# Patient Record
Sex: Female | Born: 1975 | Race: Black or African American | Hispanic: No | Marital: Single | Smoking: Current every day smoker
Health system: Southern US, Community
[De-identification: ages and names within clinical notes are randomized; demographics above are authoritative.]

## PROBLEM LIST (undated history)

## (undated) DIAGNOSIS — N209 Urinary calculus, unspecified: Secondary | ICD-10-CM

## (undated) DIAGNOSIS — D649 Anemia, unspecified: Secondary | ICD-10-CM

## (undated) DIAGNOSIS — N289 Disorder of kidney and ureter, unspecified: Secondary | ICD-10-CM

## (undated) HISTORY — DX: Anemia, unspecified: D64.9

---
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
## 1998-07-21 ENCOUNTER — Other Ambulatory Visit: Admission: RE | Admit: 1998-07-21 | Discharge: 1998-07-21 | Payer: Self-pay | Admitting: Family Medicine

## 2004-05-31 ENCOUNTER — Emergency Department (HOSPITAL_COMMUNITY): Admission: EM | Admit: 2004-05-31 | Discharge: 2004-05-31 | Payer: Self-pay | Admitting: Emergency Medicine

## 2008-03-17 ENCOUNTER — Emergency Department (HOSPITAL_COMMUNITY): Admission: EM | Admit: 2008-03-17 | Discharge: 2008-03-17 | Payer: Self-pay | Admitting: Family Medicine

## 2008-07-13 ENCOUNTER — Emergency Department (HOSPITAL_COMMUNITY): Admission: EM | Admit: 2008-07-13 | Discharge: 2008-07-13 | Payer: Self-pay | Admitting: Emergency Medicine

## 2008-09-10 ENCOUNTER — Ambulatory Visit (HOSPITAL_COMMUNITY): Admission: RE | Admit: 2008-09-10 | Discharge: 2008-09-10 | Payer: Self-pay | Admitting: Obstetrics

## 2008-12-01 ENCOUNTER — Emergency Department (HOSPITAL_COMMUNITY): Admission: EM | Admit: 2008-12-01 | Discharge: 2008-12-01 | Payer: Self-pay | Admitting: Emergency Medicine

## 2009-10-24 ENCOUNTER — Emergency Department (HOSPITAL_COMMUNITY): Admission: EM | Admit: 2009-10-24 | Discharge: 2009-10-24 | Payer: Self-pay | Admitting: Emergency Medicine

## 2010-04-15 LAB — POCT URINALYSIS DIPSTICK
Glucose, UA: NEGATIVE mg/dL
Nitrite: NEGATIVE
Protein, ur: NEGATIVE mg/dL
Urobilinogen, UA: 0.2 mg/dL (ref 0.0–1.0)
pH: 5.5 (ref 5.0–8.0)

## 2010-04-15 LAB — HERPES SIMPLEX VIRUS CULTURE

## 2010-05-05 LAB — POCT I-STAT, CHEM 8
BUN: 6 mg/dL (ref 6–23)
Chloride: 103 mEq/L (ref 96–112)
HCT: 46 % (ref 36.0–46.0)
Potassium: 3.7 mEq/L (ref 3.5–5.1)
Sodium: 138 mEq/L (ref 135–145)

## 2010-05-05 LAB — POCT RAPID STREP A (OFFICE): Streptococcus, Group A Screen (Direct): POSITIVE — AB

## 2010-05-05 LAB — POCT URINALYSIS DIP (DEVICE)
Bilirubin Urine: NEGATIVE
Glucose, UA: NEGATIVE mg/dL
Ketones, ur: NEGATIVE mg/dL
Specific Gravity, Urine: 1.015 (ref 1.005–1.030)

## 2010-05-05 LAB — POCT PREGNANCY, URINE: Preg Test, Ur: NEGATIVE

## 2010-05-18 LAB — POCT PREGNANCY, URINE: Preg Test, Ur: NEGATIVE

## 2010-05-18 LAB — POCT URINALYSIS DIP (DEVICE)
Glucose, UA: NEGATIVE mg/dL
Ketones, ur: NEGATIVE mg/dL
Specific Gravity, Urine: >=1.03 (ref 1.005–1.030)
Urobilinogen, UA: 0.2 mg/dL (ref 0.0–1.0)

## 2010-05-18 LAB — WET PREP, GENITAL
Clue Cells Wet Prep HPF POC: NONE SEEN
Trich, Wet Prep: NONE SEEN

## 2010-05-18 LAB — GC/CHLAMYDIA PROBE AMP, GENITAL
Chlamydia, DNA Probe: NEGATIVE
GC Probe Amp, Genital: NEGATIVE

## 2012-06-13 ENCOUNTER — Ambulatory Visit (INDEPENDENT_AMBULATORY_CARE_PROVIDER_SITE_OTHER): Payer: BC Managed Care – PPO | Admitting: Family Medicine

## 2012-06-13 VITALS — BP 122/83 | HR 85 | Temp 98.2°F | Resp 16 | Ht 65.58 in | Wt 155.8 lb

## 2012-06-13 DIAGNOSIS — R51 Headache: Secondary | ICD-10-CM

## 2012-06-13 DIAGNOSIS — S0990XA Unspecified injury of head, initial encounter: Secondary | ICD-10-CM

## 2012-06-13 DIAGNOSIS — R42 Dizziness and giddiness: Secondary | ICD-10-CM

## 2012-06-13 DIAGNOSIS — IMO0002 Reserved for concepts with insufficient information to code with codable children: Secondary | ICD-10-CM

## 2012-06-13 DIAGNOSIS — S40211A Abrasion of right shoulder, initial encounter: Secondary | ICD-10-CM

## 2012-06-13 MED ORDER — MECLIZINE HCL 25 MG PO TABS: 25.0000 mg | ORAL_TABLET | Freq: Three times a day (TID) | ORAL | Status: DC | PRN
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
## 2012-06-13 NOTE — Patient Instructions (Addendum)
Rest the next couple of days. I would advise against driving since you're having the dizziness.  Take the Antivert (meclizine) one every 6 hours as needed for dizziness. Will make your little drowsy.  If worse at anytime return, or go to the emergency room if we're closed.  If not doing much better by Friday come back for recheck. I will be here Friday morning.  Head Injury, Adult You have had a head injury that does not appear serious at this time. A concussion is a state of changed mental ability, usually from a blow to the head. You should take clear liquids for the rest of the day and then resume your regular diet. You should not take sedatives or alcoholic beverages for as long as directed by your caregiver after discharge. After injuries such as yours, most problems occur within the first 24 hours. SYMPTOMS These minor symptoms may be experienced after discharge:  Memory difficulties.  Dizziness.  Headaches.  Double vision.  Hearing difficulties.  Depression.  Tiredness.  Weakness.  Difficulty with concentration. If you experience any of these problems, you should not be alarmed. A concussion requires a few days for recovery. Many patients with head injuries frequently experience such symptoms. Usually, these problems disappear without medical care. If symptoms last for more than one day, notify your caregiver. See your caregiver sooner if symptoms are becoming worse rather than better. HOME CARE INSTRUCTIONS   During the next 24 hours you must stay with someone who can watch you for the warning signs listed below. Although it is unlikely that serious side effects will occur, you should be aware of signs and symptoms which may necessitate your return to this location. Side effects may occur up to 7  10 days following the injury. It is important for you to carefully monitor your condition and contact your caregiver or seek immediate medical attention if there is a change in  your condition. SEEK IMMEDIATE MEDICAL CARE IF:   There is confusion or drowsiness.  You can not awaken the injured person.  There is nausea (feeling sick to your stomach) or continued, forceful vomiting.  You notice dizziness or unsteadiness which is getting worse, or inability to walk.  You have convulsions or unconsciousness.  You experience severe, persistent headaches not relieved by over-the-counter or prescription medicines for pain. (Do not take aspirin as this impairs clotting abilities). Take other pain medications only as directed.  You can not use arms or legs normally.  There is clear or bloody discharge from the nose or ears. MAKE SURE YOU:   Understand these instructions.  Will watch your condition.  Will get help right away if you are not doing well or get worse. Document Released: 01/17/2005 Document Revised: 04/11/2011 Document Reviewed: 12/05/2008 [facility] Patient Information 2013 [place], [place].

## 2013-10-18 ENCOUNTER — Other Ambulatory Visit (HOSPITAL_COMMUNITY): Payer: Self-pay | Admitting: Obstetrics

## 2013-10-18 DIAGNOSIS — Z1231 Encounter for screening mammogram for malignant neoplasm of breast: Secondary | ICD-10-CM

## 2013-10-31 ENCOUNTER — Ambulatory Visit (HOSPITAL_COMMUNITY)
Admission: RE | Admit: 2013-10-31 | Discharge: 2013-10-31 | Disposition: A | Discharge status: Home / Self Care | Payer: BC Managed Care – PPO | Source: Ambulatory Visit | Attending: Obstetrics | Admitting: Obstetrics

## 2013-10-31 DIAGNOSIS — Z1231 Encounter for screening mammogram for malignant neoplasm of breast: Secondary | ICD-10-CM

## 2013-10-31 LAB — CYTOLOGY - PAP: Pap: NEGATIVE

## 2015-10-22 ENCOUNTER — Encounter: Payer: Self-pay | Admitting: Obstetrics & Gynecology

## 2015-10-22 ENCOUNTER — Ambulatory Visit (INDEPENDENT_AMBULATORY_CARE_PROVIDER_SITE_OTHER): Payer: 59 | Admitting: Obstetrics & Gynecology

## 2015-10-22 ENCOUNTER — Encounter: Payer: Self-pay | Admitting: *Deleted

## 2015-10-22 VITALS — BP 108/75 | HR 96 | Wt 160.0 lb

## 2015-10-22 DIAGNOSIS — Z01419 Encounter for gynecological examination (general) (routine) without abnormal findings: Secondary | ICD-10-CM | POA: Diagnosis not present

## 2015-10-22 DIAGNOSIS — Z124 Encounter for screening for malignant neoplasm of cervix: Secondary | ICD-10-CM

## 2015-10-22 DIAGNOSIS — N915 Oligomenorrhea, unspecified: Secondary | ICD-10-CM | POA: Diagnosis not present

## 2015-10-22 DIAGNOSIS — Z1151 Encounter for screening for human papillomavirus (HPV): Secondary | ICD-10-CM | POA: Diagnosis not present

## 2015-10-22 LAB — PROLACTIN: PROLACTIN: 9.4 ng/mL

## 2015-10-22 LAB — FOLLICLE STIMULATING HORMONE: FSH: 5.3 m[IU]/mL

## 2015-10-22 LAB — CBC
HCT: 36.2 % (ref 35.0–45.0)
Hemoglobin: 12.2 g/dL (ref 11.7–15.5)
MCH: 29.4 pg (ref 27.0–33.0)
MCHC: 33.7 g/dL (ref 32.0–36.0)
MCV: 87.2 fL (ref 80.0–100.0)
MPV: 8.9 fL (ref 7.5–12.5)
PLATELETS: 379 10*3/uL (ref 140–400)
RBC: 4.15 MIL/uL (ref 3.80–5.10)
RDW: 14.2 % (ref 11.0–15.0)
WBC: 8.1 10*3/uL (ref 3.8–10.8)

## 2015-10-22 LAB — TSH: TSH: 1.24 mIU/L

## 2015-10-22 LAB — HCG, QUANTITATIVE, PREGNANCY: hCG, Beta Chain, Quant, S: <2 m[IU]/mL

## 2015-10-22 MED ORDER — MEDROXYPROGESTERONE ACETATE 10 MG PO TABS: 10.0000 mg | ORAL_TABLET | Freq: Every day | ORAL | 5 refills | Status: AC

## 2015-10-22 NOTE — Patient Instructions (Signed)
Thank you for enrolling in MyChart. Please follow the instructions below to securely access your online medical record. MyChart allows you to send messages to your doctor, view your test results, manage appointments, and more.   How Do I Sign Up? 1. In your Internet browser, go to [employer] Address [employer] and enter [email or web address]. 2. Click on the Sign Up Now link in the Sign In box. You will see the New Member Sign Up page. 3. Enter your MyChart Access Code exactly as it appears below. You will not need to use this code after you've completed the sign-up process. If you do not sign up before the expiration date, you must request a new code.  MyChart Access Code: [email or web address] Expires: 12/21/2015  9:39 AM  4. Enter your Social Security Number ([id number]) and Date of Birth (mm/dd/yyyy) as indicated and click Submit. You will be taken to the next sign-up page. 5. Create a MyChart ID. This will be your MyChart login ID and cannot be changed, so think of one that is secure and easy to remember. 6. Create a MyChart password. You can change your password at any time. 7. Enter your Password Reset Question and Answer. This can be used at a later time if you forget your password.  8. Enter your e-mail address. You will receive e-mail notification when new information is available in MyChart. 9. Click Sign Up. You can now view your medical record.   Additional Information Remember, MyChart is NOT to be used for urgent needs. For medical emergencies, dial 911.

## 2015-10-22 NOTE — Progress Notes (Signed)
GYNECOLOGY ANNUAL PREVENTATIVE CARE ENCOUNTER NOTE  Subjective:   Tonya Howard is a 40 y.o. G14P0010 female here for a routine annual gynecologic exam and to establish care, was Dr. Elsie Stain patient.  Current complaints: patient has been oligomenorrheic since menarche. Was given Provera challenges 3-4 times a year by Dr. Gaynell Face, also had an early SAB a few years ago that was spontaneously conceived. Started to think about pregnancy; says Dr. Gaynell Face was going to give her Clomid. Does not recall having any evaluation for etiology of her oligomenorrhea.      Denies abnormal vaginal bleeding, discharge, pelvic pain, problems with intercourse or other gynecologic concerns.    Gynecologic History No LMP recorded. Contraception: none Last Pap: 10/2013. Results were: normal Last mammogram: 10/2013. Results were: normal  Obstetric History OB History  Gravida Para Term Preterm AB Living  1       1    SAB TAB Ectopic Multiple Live Births  1            # Outcome Date GA Lbr Len/2nd Weight Sex Delivery Anes PTL Lv  1 SAB               Past Medical History:  Diagnosis Date  . Anemia     No past surgical history on file.  No current outpatient prescriptions on file prior to visit.   No current facility-administered medications on file prior to visit.     No Known Allergies  Social History   Social History  . Marital status: Single    Spouse name: N/A  . Number of children: N/A  . Years of education: N/A   Occupational History  . Not on file.   Social History Main Topics  . Smoking status: Never Smoker  . Smokeless tobacco: Not on file  . Alcohol use Yes  . Drug use: No  . Sexual activity: Yes   Other Topics Concern  . Not on file   Social History Narrative  . No narrative on file    Family History  Problem Relation Age of Onset  . Diabetes Father   . Cancer Maternal Grandmother   . Diabetes Paternal Grandfather     The following portions of the  patient's history were reviewed and updated as appropriate: allergies, current medications, past family history, past medical history, past social history, past surgical history and problem list.  Review of Systems Pertinent items noted in HPI and remainder of comprehensive ROS otherwise negative.   Objective:  BP 108/75   Pulse 96   Wt 160 lb (72.6 kg)   BMI 26.16 kg/m  CONSTITUTIONAL: Well-developed, well-nourished female in no acute distress.  HENT:  Normocephalic, atraumatic, External right and left ear normal. Oropharynx is clear and moist EYES: Conjunctivae and EOM are normal. Pupils are equal, round, and reactive to light. No scleral icterus.  NECK: Normal range of motion, supple, no masses.  Normal thyroid.  SKIN: Skin is warm and dry. No rash noted. Not diaphoretic. No erythema. No pallor. NEUROLOGIC: Alert and oriented to person, place, and time. Normal reflexes, muscle tone coordination. No cranial nerve deficit noted. PSYCHIATRIC: Normal mood and affect. Normal behavior. Normal judgment and thought content. CARDIOVASCULAR: Normal heart rate noted, regular rhythm RESPIRATORY: Clear to auscultation bilaterally. Effort and breath sounds normal, no problems with respiration noted. BREASTS: Symmetric in size. No masses, skin changes, nipple drainage, or lymphadenopathy. ABDOMEN: Soft, normal bowel sounds, no distention noted.  No tenderness, rebound or guarding.  PELVIC:  Normal appearing external genitalia; normal appearing vaginal mucosa and cervix.  No abnormal discharge noted.  Pap smear obtained.  Normal uterine size, no other palpable masses, no uterine or adnexal tenderness. MUSCULOSKELETAL: Normal range of motion. No tenderness.  No cyanosis, clubbing, or edema.  2+ distal pulses.   Assessment:  Annual gynecologic examination with pap smear Oligomenorrhea   Plan:  1. Oligomenorrhea Will do evaluation of oligomenorrhea. Provera challenge also given. Will follow up results  and manage accordingly. Recommended infertility specialist if she wanted infertility treatment. - CBC - Follicle stimulating hormone - TSH - Testos,Total,Free and SHBG (Female) - Prolactin - Hemoglobin A1c - hCG, quantitative, pregnancy - medroxyPROGESTERone (PROVERA) 10 MG tablet; Take 1 tablet (10 mg total) by mouth daily. Use for ten days  Dispense: 10 tablet; Refill: 5 - Anti mullerian hormone  2. Encounter for gynecological examination with Papanicolaou smear of cervix - Cytology - PAP Will follow up results of pap smear and manage accordingly. Routine preventative health maintenance measures emphasized. Please refer to After Visit Summary for other counseling recommendations.    Jaynie CollinsUGONNA  Kerri Asche, MD, FACOG Attending Obstetrician & Gynecologist,  [facility]

## 2015-10-23 LAB — HEMOGLOBIN A1C
Hgb A1c MFr Bld: 5.4 % (ref <5.7)
Mean Plasma Glucose: 108 mg/dL

## 2015-10-23 LAB — CYTOLOGY - PAP

## 2015-10-24 LAB — ANTI MULLERIAN HORMONE: AMH ASSESSR: 7.41 ng/mL

## 2015-10-25 LAB — TESTOS,TOTAL,FREE AND SHBG (FEMALE)
SEX HORMONE BINDING GLOB.: 20 nmol/L (ref 17–124)
TESTOSTERONE,FREE: 4.1 pg/mL (ref 0.1–6.4)
Testosterone,Total,LC/MS/MS: 24 ng/dL (ref 2–45)

## 2015-12-04 ENCOUNTER — Encounter (HOSPITAL_COMMUNITY): Payer: Self-pay | Admitting: Emergency Medicine

## 2015-12-04 ENCOUNTER — Inpatient Hospital Stay (HOSPITAL_COMMUNITY)
Admission: EM | Admit: 2015-12-04 | Discharge: 2015-12-07 | DRG: 694 | Disposition: A | Discharge status: Home / Self Care | Payer: Self-pay | Attending: Family Medicine | Admitting: Family Medicine

## 2015-12-04 ENCOUNTER — Emergency Department (HOSPITAL_COMMUNITY): Payer: Self-pay

## 2015-12-04 ENCOUNTER — Ambulatory Visit (HOSPITAL_COMMUNITY)
Admission: EM | Admit: 2015-12-04 | Discharge: 2015-12-04 | Disposition: A | Discharge status: Nursing Home (Includes ICF) | Payer: 59 | Attending: Family Medicine | Admitting: Family Medicine

## 2015-12-04 DIAGNOSIS — R1031 Right lower quadrant pain: Secondary | ICD-10-CM | POA: Insufficient documentation

## 2015-12-04 DIAGNOSIS — N200 Calculus of kidney: Secondary | ICD-10-CM

## 2015-12-04 DIAGNOSIS — Z91013 Allergy to seafood: Secondary | ICD-10-CM

## 2015-12-04 DIAGNOSIS — N132 Hydronephrosis with renal and ureteral calculous obstruction: Principal | ICD-10-CM | POA: Diagnosis present

## 2015-12-04 DIAGNOSIS — N390 Urinary tract infection, site not specified: Secondary | ICD-10-CM | POA: Diagnosis not present

## 2015-12-04 DIAGNOSIS — D649 Anemia, unspecified: Secondary | ICD-10-CM | POA: Insufficient documentation

## 2015-12-04 DIAGNOSIS — R8281 Pyuria: Secondary | ICD-10-CM

## 2015-12-04 DIAGNOSIS — Z87891 Personal history of nicotine dependence: Secondary | ICD-10-CM

## 2015-12-04 DIAGNOSIS — R1010 Upper abdominal pain, unspecified: Secondary | ICD-10-CM | POA: Diagnosis not present

## 2015-12-04 LAB — URINALYSIS, ROUTINE W REFLEX MICROSCOPIC
BILIRUBIN URINE: NEGATIVE
GLUCOSE, UA: NEGATIVE mg/dL
KETONES UR: NEGATIVE mg/dL
NITRITE: NEGATIVE
PH: 7.5 (ref 5.0–8.0)
Protein, ur: 100 mg/dL — AB
SPECIFIC GRAVITY, URINE: 1.019 (ref 1.005–1.030)

## 2015-12-04 LAB — COMPREHENSIVE METABOLIC PANEL
ALK PHOS: 81 U/L (ref 38–126)
ALT: 13 U/L — AB (ref 14–54)
AST: 18 U/L (ref 15–41)
Albumin: 3.3 g/dL — ABNORMAL LOW (ref 3.5–5.0)
Anion gap: 6 (ref 5–15)
BUN: 8 mg/dL (ref 6–20)
CALCIUM: 9.8 mg/dL (ref 8.9–10.3)
CHLORIDE: 104 mmol/L (ref 101–111)
CO2: 27 mmol/L (ref 22–32)
CREATININE: 1.1 mg/dL — AB (ref 0.44–1.00)
GFR calc non Af Amer: >60 mL/min (ref >60)
GLUCOSE: 130 mg/dL — AB (ref 65–99)
Potassium: 4.1 mmol/L (ref 3.5–5.1)
SODIUM: 137 mmol/L (ref 135–145)
Total Bilirubin: 0.3 mg/dL (ref 0.3–1.2)
Total Protein: 8.5 g/dL — ABNORMAL HIGH (ref 6.5–8.1)

## 2015-12-04 LAB — CBC
HCT: 34 % — ABNORMAL LOW (ref 36.0–46.0)
Hemoglobin: 11 g/dL — ABNORMAL LOW (ref 12.0–15.0)
MCH: 28.7 pg (ref 26.0–34.0)
MCHC: 32.4 g/dL (ref 30.0–36.0)
MCV: 88.8 fL (ref 78.0–100.0)
PLATELETS: 432 10*3/uL — AB (ref 150–400)
RBC: 3.83 MIL/uL — AB (ref 3.87–5.11)
RDW: 13.4 % (ref 11.5–15.5)
WBC: 9.9 10*3/uL (ref 4.0–10.5)

## 2015-12-04 LAB — PROTIME-INR
INR: 1.02
Prothrombin Time: 13.5 seconds (ref 11.4–15.2)

## 2015-12-04 LAB — I-STAT BETA HCG BLOOD, ED (MC, WL, AP ONLY)

## 2015-12-04 LAB — APTT: APTT: 34 s (ref 24–36)

## 2015-12-04 LAB — URINE MICROSCOPIC-ADD ON

## 2015-12-04 LAB — LIPASE, BLOOD: LIPASE: 48 U/L (ref 11–51)

## 2015-12-04 MED ORDER — IOPAMIDOL (ISOVUE-300) INJECTION 61%
INTRAVENOUS | Status: AC
  Administered 2015-12-04: 100 mL
  Filled 2015-12-04: qty 100

## 2015-12-04 MED ORDER — GI COCKTAIL ~~LOC~~
30.0000 mL | Freq: Once | ORAL | Status: AC
  Administered 2015-12-04: 30 mL via ORAL
  Filled 2015-12-04: qty 30

## 2015-12-04 MED ORDER — DEXTROSE 5 % IV SOLN
1.0000 g | INTRAVENOUS | Status: DC
  Administered 2015-12-05 – 2015-12-07 (×3): 1 g via INTRAVENOUS
  Filled 2015-12-04 (×3): qty 10

## 2015-12-04 MED ORDER — FOLIC ACID 1 MG PO TABS
1.0000 mg | ORAL_TABLET | Freq: Every day | ORAL | Status: DC
  Administered 2015-12-05 – 2015-12-07 (×3): 1 mg via ORAL
  Filled 2015-12-04 (×3): qty 1

## 2015-12-04 MED ORDER — ENOXAPARIN SODIUM 40 MG/0.4ML ~~LOC~~ SOLN
40.0000 mg | SUBCUTANEOUS | Status: DC
  Administered 2015-12-04 – 2015-12-06 (×2): 40 mg via SUBCUTANEOUS
  Filled 2015-12-04 (×2): qty 0.4

## 2015-12-04 MED ORDER — PANTOPRAZOLE SODIUM 40 MG PO TBEC
40.0000 mg | DELAYED_RELEASE_TABLET | Freq: Every day | ORAL | Status: DC
  Administered 2015-12-04 – 2015-12-07 (×4): 40 mg via ORAL
  Filled 2015-12-04 (×4): qty 1

## 2015-12-04 MED ORDER — ACETAMINOPHEN 325 MG PO TABS
650.0000 mg | ORAL_TABLET | Freq: Four times a day (QID) | ORAL | Status: DC | PRN
  Administered 2015-12-04 – 2015-12-05 (×3): 650 mg via ORAL
  Filled 2015-12-04 (×3): qty 2

## 2015-12-04 MED ORDER — DEXTROSE 5 % IV SOLN
1.0000 g | Freq: Once | INTRAVENOUS | Status: AC
  Administered 2015-12-04: 1 g via INTRAVENOUS
  Filled 2015-12-04: qty 10

## 2015-12-04 MED ORDER — SODIUM CHLORIDE 0.9 % IV SOLN
INTRAVENOUS | Status: DC
  Administered 2015-12-04: 23:00:00 via INTRAVENOUS

## 2015-12-04 NOTE — H&P (Signed)
Family Medicine [facility] Admission History and Physical Service Pager: [phone]  Patient name: Tonya Howard Medical record number: [id number] Date of birth: Sep 02, 1975 Age: 40 y.o. Gender: female  Primary Care Provider: No PCP Per Patient Consultants: Urology, IR Code Status: FULL  Chief Complaint: RUQ abdominal pain  Assessment and Plan: Tonya Howard is a 40 y.o. female presenting with RUQ pain . No significant PMH.   #Abdominal pain.  Non acute abdomen, no pain when evaluated by tis team. On admission, VSS, afebrile and no white count, WBC 9.9 .  Previously presented to urgent care clinic for ongoing RUQ pain x 6 months that has worsened over past 2 weeks by extending to to lower abdominal pain.  Pain more frequent and intense after eating.  Has seen OB/GYN and PCP with negative work up at these visits  Today, Lipase wnl 48, AST/ALT 18/13, Tbili 0.3.  Marland Kitchen.  Urine pregnancy test negative.  UA dirty and turbid with many bacteria, wbcs too numerous to count , mod hgb, large leukoctes, and negative for nitrite.  Ucx, Bcx obtained and s/p 1 dose CTX in ED.   PT, PTT, INR wnl. 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  Additional right renal stones. Abdominal pain likely due to large right renal calculus, less likely pancreatic or hepatic in nature given normal labs. Consider biliary colic vs GERD given pain associated with eating. Urology was consulted by the ED regarding stone and given size as well as concern for infection they recommended admission for further work up -Admit to inpatient, attending Dr. Jennette KettleNeal  -Gentle Fluids IV NS @75  cc.hr -Consult urology in AM - (Per ED provider, Dr. Annabell HowellsWrenn likely will place a stent next week but can be done as outpatient).  -IR to perform percutaneous nephrostomy tomorrow morning, per urology recs [facility](Borofsky MS,  Wilford SportsWalter D, Shah O, Goldfarb DS, [facility]ues [facility], Dionisio DavidMakarov DV Surgical decompression is associated with decreased mortality in patients with sepsis and ureteral calculi. J Urol. 2013;189(3):946. Epub 2012 Sep 24.) -NPO at midnight tonight  -monitor for fevers -Tylenol 650 mg Q6h prn -Blood cx  -Urine cx -CBC  -AM BMET  -vitals per unit routine  #UTI . No CVA tenderness or fever making pyelonephritis less likely. No evidence of sepsis. Patient reporting urinary frequency/urgency x 3 months. Was prescribed antibiotics in the past with no improvement. No dysuria, or back pain. She does report episodic lower abdominal pain.  UA as above. Given 1 dose CTX in ED.  -Continue CTX 1 g daily -Urine cx -abx on discharge per urology recs   FEN/GI: NPO after midnight, IV NS @ 75cc.hr Prophylaxis: Lovenox  Disposition: Admit to inpatient, attending Dr. Jennette KettleNeal  History of Present Illness:  Tonya Howard is a 40 y.o. female presenting with abdominal pain.  Patient states she has been having RUQ  abdominal pain for the past 6-7 months which has recently worsened over the past 2 weeks.  Urine has been cloudy as well. Over the last 2 weeks she has noted her pain sometimes her pain radiates to the lower abdomen.  Endorses episodes of sweating at home when she has the pain, denies fevers.  Reports she has been having increased urgency and frequency on urination but denies burning.  Pain worsens with eating and heat packs have helped improve the pain somewhat. Has been to her doctor and was prescribed  antibiotics in the past but with no improvement.  Has also seen her gynecologist for workup. Denies nausea, emesis, diarrhea. Denies back pain. Denies chest pain, shortness of breath   No history of drug use.  Drinks alcohol socially.  Stopped smoking 3 weeks ago, used to smoke about a pack/week.  Is not currently pregnant.    Review Of Systems: Per HPI with the following additions:  Review of Systems  Constitutional:  Negative for chills, fever and malaise/fatigue.  HENT: Negative for congestion.   Eyes: Negative.   Respiratory: Negative for cough and shortness of breath.   Cardiovascular: Negative for chest pain, palpitations, leg swelling and PND.  Gastrointestinal: Positive for abdominal pain. Negative for constipation, diarrhea, nausea and vomiting.  Genitourinary: Positive for frequency and urgency.  Musculoskeletal: Negative.   Neurological: Negative.  Negative for weakness and headaches.  Endo/Heme/Allergies: Does not bruise/bleed easily.   Patient Active Problem List   Diagnosis Date Noted  . UTI (urinary tract infection) 12/04/2015   Past Medical History: Past Medical History:  Diagnosis Date  . Anemia    Past Surgical History: Past Surgical History:  Procedure Laterality Date  . BREAST SURGERY     Social History: Social History  Substance Use Topics  . Smoking status: Never Smoker  . Smokeless tobacco: Never Used  . Alcohol use Yes   Family History: Family History  Problem Relation Age of Onset  . Diabetes Father   . Cancer Maternal Grandmother   . Diabetes Paternal Grandfather    Allergies and Medications: Allergies  Allergen Reactions  . Shellfish Allergy Hives and Itching   No current facility-administered medications on file prior to encounter.    Current Outpatient Prescriptions on File Prior to Encounter  Medication Sig Dispense Refill  . medroxyPROGESTERone (PROVERA) 10 MG tablet Take 1 tablet (10 mg total) by mouth daily. Use for ten days (Patient not taking: Reported on 12/04/2015) 10 tablet 5   Objective: BP 121/72   Pulse 89   Temp 98.4 F (36.9 C) (Oral)   Resp 16   LMP 11/30/2015 (Approximate)   SpO2 100%  Exam: General: 40 yo female sitting in hospital bed,  In NAD Eyes: PERRL, EOMI, no scleral icterus ENTM: MMM, oropharynx clear Neck: normal, supple Cardiovascular: RRR, no MRG Respiratory: CTA B/L, no wheezing, rales or rhonchi, normal work of  breathing Gastrointestinal: soft, largely non tender, no rebound or guarding, states she " can feel the examiner palpating her RUQ and lower abdomen" more than other abdominal areas, no guarding, +bs, Murphy's sign negative MSK: no edema or tenderness, no CVA tenderness Derm: warm, dry, no rashes noted Neuro: AAOx3, no focal deficits on exam, speech normal Psych: pleasant, mood appropriate  Labs and Imaging: CBC BMET   Recent Labs Lab 12/04/15 1334  WBC 9.9  HGB 11.0*  HCT 34.0*  PLT 432*    Recent Labs Lab 12/04/15 1334  NA 137  K 4.1  CL 104  CO2 27  BUN 8  CREATININE 1.10*  GLUCOSE 130*  CALCIUM 9.8     Ct Abdomen Pelvis W Contrast  Result Date: 12/04/2015 CLINICAL DATA:  Right-sided abdominal pain for 6-7 months with worsening over the past 2 months. Nausea and diaphoresis. EXAM: CT ABDOMEN AND PELVIS WITH CONTRAST TECHNIQUE: Multidetector CT imaging of the abdomen and pelvis was performed using the standard protocol following bolus administration of intravenous contrast. CONTRAST:  ISOVUE-300 IOPAMIDOL (ISOVUE-300) INJECTION 61% COMPARISON:  None. FINDINGS: Lower chest: Lung bases show no acute  findings. Heart size normal. No pericardial effusion. Hepatobiliary: 11 mm low-attenuation lesion in the central aspect of the liver is too small to characterize. Liver and gallbladder are otherwise unremarkable. No biliary ductal dilatation. Pancreas: Negative. Spleen: Negative. Adrenals/Urinary Tract: Adrenal glands are unremarkable. Severe right hydronephrosis with a staghorn calculus in the right renal pelvis. Separate stones are seen in the upper and lower poles of the right kidney. There is no excretion of contrast from the right kidney on nephrographic phase imaging. Right ureter is decompressed. Left kidney is unremarkable and left ureter is decompressed. Bladder is unremarkable. Stomach/Bowel: Stomach, small bowel, appendix and colon are unremarkable. Vascular/Lymphatic:  Vascular structures are unremarkable. Lymph nodes in the renal hila measure up to 14 mm. Additional retroperitoneal lymph nodes along the course of the infrarenal aorta are sub cm in short axis size. Porta hepatis and abdominal peritoneal ligament lymph nodes are also sub cm in short axis size. Reproductive: Uterus is visualized.  No adnexal mass. Other: No free fluid. Mesenteries and peritoneum are unremarkable. Small periumbilical hernia contains fat. Musculoskeletal: No worrisome lytic or sclerotic lesions. IMPRESSION: 1. 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 2. Enlarged lymph nodes in the right renal hilum, likely reactive. 3. Additional right renal stones. Electronically Signed   By: Leanna BattlesMelinda  Blietz M.D.   On: 12/04/2015 17:21   Freddrick MarchYashika Amin, MD 12/04/2015, 9:55 PM PGY-1, [facility] FPTS Intern pager: [phone], text pages welcome   I have seen and examined the patient. I have read and agree with the above note. My changes are noted in blue.  Oluwateniola Leitch A. Kennon RoundsHaney MD, MS Family Medicine Resident PGY-2 Pager [phone]

## 2015-12-04 NOTE — ED Triage Notes (Addendum)
Pt from home with c/o RUQ and RLQ abdominal pain with urinary frequency worsening with start of complaint 6 months ago.  [facility] and was told to come here for a severe UTI and the need for a CT scan.  [facility]  Pt in NAD, ambulatory, A&O.

## 2015-12-04 NOTE — ED Provider Notes (Signed)
[facility]    CSN: [id number] Arrival date & time: 12/04/15  1208     History   Chief Complaint Chief Complaint  Patient presents with  . Abdominal Pain    HPI Tonya Howard is a 40 y.o. female.   Is a 40 year old woman who comes in with 7 months of intermittent abdominal pain which is gotten worse the last 2 weeks. She saw rehabilitation evaluations by her OB/GYN and her personal care provider.  The abdominal pain is associated with some urinary frequency. She describes her abdominal pain is in the right upper quadrant as well as the right lower quadrant. She's had no nausea or vomiting and no fever.  Patient works as a Paramedictherapist.        Past Medical History:  Diagnosis Date  . Anemia     There are no active problems to display for this patient.   History reviewed. No pertinent surgical history.  OB History    Gravida Para Term Preterm AB Living   1       1     SAB TAB Ectopic Multiple Live Births   1               Home Medications    Prior to Admission medications   Medication Sig Start Date End Date Taking? Authorizing Provider  medroxyPROGESTERone (PROVERA) 10 MG tablet Take 1 tablet (10 mg total) by mouth daily. Use for ten days 10/22/15   Tereso NewcomerUgonna A Anyanwu, MD    Family History Family History  Problem Relation Age of Onset  . Diabetes Father   . Cancer Maternal Grandmother   . Diabetes Paternal Grandfather     Social History Social History  Substance Use Topics  . Smoking status: Never Smoker  . Smokeless tobacco: Never Used  . Alcohol use Yes     Allergies   Review of patient's allergies indicates no known allergies.   Review of Systems Review of Systems  Constitutional: Positive for diaphoresis.  HENT: Negative.   Respiratory: Negative.   Cardiovascular: Negative.   Gastrointestinal: Positive for abdominal pain. Negative for vomiting.  Genitourinary: Positive for frequency.  Neurological: Negative.     Hematological: Negative.   Psychiatric/Behavioral: Negative.      Physical Exam Triage Vital Signs ED Triage Vitals  Enc Vitals Group     BP 12/04/15 1228 114/76     Pulse Rate 12/04/15 1228 90     Resp 12/04/15 1228 16     Temp 12/04/15 1228 98.4 F (36.9 C)     Temp Source 12/04/15 1228 Oral     SpO2 12/04/15 1228 100 %     Weight --      Height --      Head Circumference --      Peak Flow --      Pain Score 12/04/15 1234 6     Pain Loc --      Pain Edu? --      Excl. in GC? --    No data found.   Updated Vital Signs BP 114/76 (BP Location: Right Arm)   Pulse 90   Temp 98.4 F (36.9 C) (Oral)   Resp 16   LMP 11/30/2015 (Approximate)   SpO2 100%    Physical Exam  Constitutional: She is oriented to person, place, and time. She appears well-developed and well-nourished.  HENT:  Head: Normocephalic.  Right Ear: External ear normal.  Left Ear: External ear normal.  Nose: Nose normal.  Mouth/Throat: Oropharynx is clear and moist.  Eyes: Conjunctivae and EOM are normal. Pupils are equal, round, and reactive to light.  Neck: Normal range of motion. Neck supple.  Cardiovascular: Normal rate, regular rhythm and normal heart sounds.   Pulmonary/Chest: Effort normal and breath sounds normal.  Abdominal: Soft. Bowel sounds are normal. There is tenderness.  Patient is tender in the right upper epigastrium with deep palpation and she is also tender in the right lower quadrant just above the inguinal ligament.  She has no guarding or rebound  Musculoskeletal: Normal range of motion.  Lymphadenopathy:    She has no cervical adenopathy.  Neurological: She is alert and oriented to person, place, and time.  Skin: Skin is warm and dry.  Patient has a healing burn area on her right abdomen which she suffered after trying to steam shirt while wearing it.  Psychiatric: She has a normal mood and affect. Her behavior is normal. Judgment and thought content normal.  Nursing note  and vitals reviewed.    UC Treatments / Results  Labs (all labs ordered are listed, but only abnormal results are displayed) Labs Reviewed  URINE CULTURE    EKG  EKG Interpretation None       Radiology No results found.  Procedures Procedures (including critical care time)  Medications Ordered in UC Medications - No data to display   Initial Impression / Assessment and Plan / UC Course  I have reviewed the triage vital signs and the nursing notes.  Pertinent labs & imaging results that were available during my care of the patient were reviewed by me and considered in my medical decision making (see chart for details).  Clinical Course   Final Clinical Impressions(s) / UC Diagnoses   Final diagnoses:  Pain of upper abdomen  Right lower quadrant abdominal pain  Pyuria  This promising going on for 7 months and is getting worse. She's been treated several times for UTI and never improved. She does have pyuria and the possibility of a stone or perinephric abscess is certainly a consideration. I believe she needs imaging of her abdomen and therefore I'm sending her down to the emergency department.  New Prescriptions New Prescriptions   No medications on file  Transfer to the emergency department for further evaluation.   Elvina SidleKurt Tene Gato, MD 12/04/15 1304

## 2015-12-04 NOTE — ED Notes (Signed)
Family Practice WILL see the patient

## 2015-12-04 NOTE — ED Notes (Signed)
Urinalysis would not read patients urine, attempted a total of three times my self and Livia. Strip was then read visualy by me from the bottle: patients urine read as follows: LEU-Large, NIT-Neg, URO-0.2mg /dL, ZO-1.0pH-6.0, PRO-30mg /dL, BLO- Large, SG- [id number], KET- Trace (5 mg/dL), BIL- Neg, GLU- Neg, Results reported to Physician

## 2015-12-04 NOTE — ED Notes (Signed)
Attempted report 

## 2015-12-04 NOTE — Progress Notes (Addendum)
Family Medicine Teaching Service Daily Progress Note Intern Pager: [phone]  Patient name: Tonya Howard Medical record number: [id number] Date of birth: 06/19/1975 Age: 40 y.o. Gender: female  Primary Care Provider: No PCP Per Patient Consultants: Urology Code Status: Full  Pt Overview and Major Events to Date:  11/3: admitted for abdominal pain ad found to have a right renal stone  Assessment and Plan: Tonya Howard is a 40 y.o. female presenting with abdominal pain found to have an obstructing right staghorn calculus. No significant PMH.   Renal stone- - Urology consulted - plan for per neph tube placement today - follow further renal recs - tylenol PRN pain  Abdominal pain- pain overnight releived with heating pad, GI cocktail and tylenol. Could be due to stone vs GERD vs biliary colic, lab evaluation wnl, no evidence of billiary disease on CT scan - tylenol PRN, IV morphine PRN while NPO  UTI- concern for infected stone, no evidence of sepsis, afebrile  - Rosephin q24 hr - follow further urology recs for abx continuation - follow UCx, BCx   FEN/GI: NPO, D5 NS 75 cc/hr while NPO PPx: lovenox  Disposition: home pending clinical improvement  Subjective:  HAd some abdominal pain overnight, treated with GO cocktail, pepcid, tylenol and a heating pad which resolved her pain, else slept well, denies SOB, chest pain  Objective: Temp:  [98.4 F (36.9 C)] 98.4 F (36.9 C) (11/03 1328) Pulse Rate:  [81-102] 89 (11/03 1945) Resp:  [16-18] 16 (11/03 1945) BP: (106-129)/(72-92) 121/72 (11/03 1945) SpO2:  [100 %] 100 % (11/03 1945) Physical Exam: General: NAD, lying in bed comfortably Cardiovascular: RRR, no murmurs Respiratory: CTAB, normal WOB Abdomen: soft, non tender no rebound or gaurding    Laboratory:  Recent Labs Lab 12/04/15 1334  WBC 9.9  HGB 11.0*  HCT 34.0*  PLT 432*    Recent Labs Lab 12/04/15 1334  NA 137  K 4.1  CL 104  CO2 27  BUN 8   CREATININE 1.10*  CALCIUM 9.8  PROT 8.5*  BILITOT 0.3  ALKPHOS 81  ALT 13*  AST 18  GLUCOSE 130*      Imaging/Diagnostic Tests:   Bonney AidAlyssa A Haney, MD 12/04/2015, 10:40 PM PGY-3, [facility] FPTS Intern pager: [phone], text pages welcome

## 2015-12-04 NOTE — ED Provider Notes (Signed)
[facility]   CSN: [id number] Arrival date & time: 12/04/15  1322     History   Chief Complaint Chief Complaint  Patient presents with  . Abdominal Pain    HPI Tonya Howard is a 40 y.o. female.  HPI Patient presents to the emergency room with complaints of several months of persistent abdominal pain.  The patient saw her primary doctor previously. She was told she had a urinary tract infection and was prescribed antibiotics. Her symptoms persisted despite that treatment. She ultimately went to see her OB/GYN doctor. She had laboratory tests and an exam and was told that everything was normal. Patient continues to have pain mainly in the upper and sometimes lower part of the abdomen. She does think it tends to get worse with eating. He denies any nausea or vomiting. No fever. She has noticed some urinary frequency  The patient was evaluated at the urgent care today. She was told she had an abnormal dip urine specimen. She was instructed to come down to the emergency room to get a CAT scan. Past Medical History:  Diagnosis Date  . Anemia     There are no active problems to display for this patient.   Past Surgical History:  Procedure Laterality Date  . BREAST SURGERY      OB History    Gravida Para Term Preterm AB Living   1       1     SAB TAB Ectopic Multiple Live Births   1               Home Medications    Prior to Admission medications   Medication Sig Start Date End Date Taking? Authorizing Provider  medroxyPROGESTERone (PROVERA) 10 MG tablet Take 1 tablet (10 mg total) by mouth daily. Use for ten days 10/22/15   Tereso NewcomerUgonna A Anyanwu, MD    Family History Family History  Problem Relation Age of Onset  . Diabetes Father   . Cancer Maternal Grandmother   . Diabetes Paternal Grandfather     Social History Social History  Substance Use Topics  . Smoking status: Never Smoker  . Smokeless tobacco: Never Used  . Alcohol use Yes      Allergies   Shellfish allergy   Review of Systems Review of Systems  All other systems reviewed and are negative.    Physical Exam Updated Vital Signs BP 129/92   Pulse 98   Temp 98.4 F (36.9 C) (Oral)   Resp 18   LMP 11/30/2015 (Approximate)   SpO2 100%   Physical Exam  Constitutional: She appears well-developed and well-nourished. No distress.  HENT:  Head: Normocephalic and atraumatic.  Right Ear: External ear normal.  Left Ear: External ear normal.  Eyes: Conjunctivae are normal. Right eye exhibits no discharge. Left eye exhibits no discharge. No scleral icterus.  Neck: Neck supple. No tracheal deviation present.  Cardiovascular: Normal rate, regular rhythm and intact distal pulses.   Pulmonary/Chest: Effort normal and breath sounds normal. No stridor. No respiratory distress. She has no wheezes. She has no rales.  Abdominal: Soft. Bowel sounds are normal. She exhibits no distension. There is tenderness in the right upper quadrant. There is no rebound and no guarding.  Musculoskeletal: She exhibits no edema or tenderness.  Neurological: She is alert. She has normal strength. No cranial nerve deficit (no facial droop, extraocular movements intact, no slurred speech) or sensory deficit. She exhibits normal muscle tone. She displays no seizure activity.  Coordination normal.  Skin: Skin is warm and dry. No rash noted.  Psychiatric: She has a normal mood and affect.  Nursing note and vitals reviewed.    ED Treatments / Results  Labs (all labs ordered are listed, but only abnormal results are displayed) Labs Reviewed  COMPREHENSIVE METABOLIC PANEL - Abnormal; Notable for the following:       Result Value   Glucose, Bld 130 (*)    Creatinine, Ser 1.10 (*)    Total Protein 8.5 (*)    Albumin 3.3 (*)    ALT 13 (*)    All other components within normal limits  CBC - Abnormal; Notable for the following:    RBC 3.83 (*)    Hemoglobin 11.0 (*)    HCT 34.0 (*)     Platelets 432 (*)    All other components within normal limits  LIPASE, BLOOD  URINALYSIS, ROUTINE W REFLEX MICROSCOPIC (NOT AT [facility])  I-STAT BETA HCG BLOOD, ED ([facility], [facility], [facility] ONLY)    Radiology No results found.  Procedures Procedures (including critical care time)  Medications Ordered in ED Medications - No data to display   Initial Impression / Assessment and Plan / ED Course  I have reviewed the triage vital signs and the nursing notes.  Pertinent labs & imaging results that were available during my care of the patient were reviewed by me and considered in my medical decision making (see chart for details).  Clinical Course  Comment By Time  I reviewed Dr. Aretta NipLowenstein's note. He is concerned about the possibility of a renal abscess or renal stone. He recommended a CT scan. Linwood DibblesJon Kensy Blizard, MD 11/03 1546  Labs in the ED are reassuring.  Urine culture sent from urgent care.  Will check a formal ua here and ct scan has been ordered.  Discussed plans with the patient. Linwood DibblesJon Jlyn Bracamonte, MD 11/03 [id number]     Final Clinical Impressions(s) / ED Diagnoses  Care will be turned over to Dr Elease EtiennePickering   Tyquavious Gamel, MD 12/04/15 [phone]

## 2015-12-04 NOTE — ED Triage Notes (Addendum)
[facility] a [facility]Q pain that has been ongoing for for 7 months but has become more intense over the last 2 weeks. The patient stated that the pain starts in her RUQ and radiates to her RLQ which is new. She also reported that she is having urinary frequency/urgency for about 3 months. She reported that she has seen her PCP and her OB/GYN that put her on medication. The patient reported that the pain has not gotten any better. The patient also stated the pain is more frequent and intense after eating.

## 2015-12-04 NOTE — ED Notes (Signed)
Pt declined shuttle service... Will go down by PV .... Adv pt NPO .Marland Kitchen.Marland Kitchen. Pt verb understanding.

## 2015-12-04 NOTE — ED Provider Notes (Signed)
  Physical Exam  BP 112/88   Pulse 85   Temp 98.4 F (36.9 C) (Oral)   Resp 18   LMP 11/30/2015 (Approximate)   SpO2 100%   Physical Exam  ED Course  Procedures  MDM CT scan shows infected obstructed stone. Patient is well-appearing. Discussed with Dr. Annabell HowellsWrenn from urology. Will need percutaneous nephrostomy and antibiotics. Will need admission to internal medicine. Discussed with Dr. Denny LevySchick, from interventional radiology. He will do percutaneous nephrostomy tomorrow. Patient will be nothing by mouth at midnight. Discussed with family medicine, will admit the patient.       Tonya CoreNathan Shavonte Zhao, MD 12/04/15 [phone]

## 2015-12-05 ENCOUNTER — Inpatient Hospital Stay (HOSPITAL_COMMUNITY): Payer: Self-pay

## 2015-12-05 ENCOUNTER — Encounter (HOSPITAL_COMMUNITY): Payer: Self-pay | Admitting: Physician Assistant

## 2015-12-05 HISTORY — PX: IR GENERIC HISTORICAL: IMG1180011

## 2015-12-05 LAB — BASIC METABOLIC PANEL
Anion gap: 6 (ref 5–15)
BUN: 8 mg/dL (ref 6–20)
CHLORIDE: 104 mmol/L (ref 101–111)
CO2: 27 mmol/L (ref 22–32)
CREATININE: 1.02 mg/dL — AB (ref 0.44–1.00)
Calcium: 9 mg/dL (ref 8.9–10.3)
GFR calc Af Amer: >60 mL/min (ref >60)
GFR calc non Af Amer: >60 mL/min (ref >60)
GLUCOSE: 75 mg/dL (ref 65–99)
POTASSIUM: 3.8 mmol/L (ref 3.5–5.1)
Sodium: 137 mmol/L (ref 135–145)

## 2015-12-05 LAB — CBC
HCT: 32.3 % — ABNORMAL LOW (ref 36.0–46.0)
HEMOGLOBIN: 10.1 g/dL — AB (ref 12.0–15.0)
MCH: 27.9 pg (ref 26.0–34.0)
MCHC: 31.3 g/dL (ref 30.0–36.0)
MCV: 89.2 fL (ref 78.0–100.0)
Platelets: 384 10*3/uL (ref 150–400)
RBC: 3.62 MIL/uL — AB (ref 3.87–5.11)
RDW: 13.3 % (ref 11.5–15.5)
WBC: 10 10*3/uL (ref 4.0–10.5)

## 2015-12-05 LAB — URINE CULTURE

## 2015-12-05 MED ORDER — IOPAMIDOL (ISOVUE-300) INJECTION 61%
INTRAVENOUS | Status: AC
  Administered 2015-12-05: 20 mL
  Filled 2015-12-05: qty 50

## 2015-12-05 MED ORDER — MIDAZOLAM HCL 2 MG/2ML IJ SOLN
INTRAMUSCULAR | Status: AC
  Filled 2015-12-05: qty 2

## 2015-12-05 MED ORDER — MIDAZOLAM HCL 2 MG/2ML IJ SOLN
INTRAMUSCULAR | Status: AC | PRN
Start: 2015-12-05 — End: 2015-12-05
  Administered 2015-12-05 (×2): 1 mg via INTRAVENOUS

## 2015-12-05 MED ORDER — MORPHINE SULFATE (PF) 2 MG/ML IV SOLN
2.0000 mg | INTRAVENOUS | Status: DC | PRN
  Administered 2015-12-05 – 2015-12-06 (×7): 2 mg via INTRAVENOUS
  Filled 2015-12-05 (×7): qty 1

## 2015-12-05 MED ORDER — DEXTROSE-NACL 5-0.9 % IV SOLN
INTRAVENOUS | Status: DC
  Administered 2015-12-05 (×2): via INTRAVENOUS

## 2015-12-05 MED ORDER — CIPROFLOXACIN IN D5W 400 MG/200ML IV SOLN
400.0000 mg | Freq: Once | INTRAVENOUS | Status: AC
  Administered 2015-12-05: 400 mg via INTRAVENOUS
  Filled 2015-12-05: qty 200

## 2015-12-05 MED ORDER — FENTANYL CITRATE (PF) 100 MCG/2ML IJ SOLN
INTRAMUSCULAR | Status: AC
  Filled 2015-12-05: qty 2

## 2015-12-05 MED ORDER — FENTANYL CITRATE (PF) 100 MCG/2ML IJ SOLN
INTRAMUSCULAR | Status: AC | PRN
  Administered 2015-12-05 (×2): 50 ug via INTRAVENOUS

## 2015-12-05 MED ORDER — MORPHINE SULFATE (PF) 2 MG/ML IV SOLN
2.0000 mg | INTRAVENOUS | Status: DC | PRN
  Administered 2015-12-05: 2 mg via INTRAVENOUS
  Filled 2015-12-05: qty 1

## 2015-12-05 MED ORDER — LIDOCAINE HCL 1 % IJ SOLN
INTRAMUSCULAR | Status: AC
  Administered 2015-12-05: 10:00:00
  Filled 2015-12-05: qty 20

## 2015-12-05 MED ORDER — FENTANYL CITRATE (PF) 100 MCG/2ML IJ SOLN
INTRAMUSCULAR | Status: AC
  Administered 2015-12-05: 11:00:00
  Filled 2015-12-05: qty 2

## 2015-12-05 MED ORDER — LIDOCAINE HCL 1 % IJ SOLN
INTRAMUSCULAR | Status: AC | PRN
  Administered 2015-12-05: 10 mL

## 2015-12-05 MED ORDER — MIDAZOLAM HCL 2 MG/2ML IJ SOLN
INTRAMUSCULAR | Status: AC
  Administered 2015-12-05: 11:00:00
  Filled 2015-12-05: qty 2

## 2015-12-05 NOTE — Consult Note (Signed)
Subjective: CC: Right flank pain.  Hx:  I was asked to see Tonya Howard in consultation by Dr. Kennon RoundsHaney for a right staghorn stone with obstruction and UTI.   She had the onset about 6 months ago of some right flank discomfort.  Over the last month she has had increased pain and was treated by her PCP for 2 UTI's.   She has had no hematuria but has had cloudy urine.  Yesterday the pain became more acute and was associated with nausea and vomiting but no fever.   A CT in the ER shows an obstructing right staghorn stone with additional right renal stones.   Her UA looks infected but she is afebrile without a leukocytosis.   Cultures are pending.  ROS:  Review of Systems  All other systems reviewed and are negative.   Allergies  Allergen Reactions  . Shellfish Allergy Hives and Itching    Past Medical History:  Diagnosis Date  . Anemia     Past Surgical History:  Procedure Laterality Date  . BREAST SURGERY      Social History   Social History  . Marital status: Single    Spouse name: N/A  . Number of children: N/A  . Years of education: N/A   Occupational History  . Not on file.   Social History Main Topics  . Smoking status: Never Smoker  . Smokeless tobacco: Never Used  . Alcohol use Yes  . Drug use: No  . Sexual activity: Yes   Other Topics Concern  . Not on file   Social History Narrative  . No narrative on file    Family History  Problem Relation Age of Onset  . Diabetes Father   . Cancer Maternal Grandmother   . Diabetes Paternal Grandfather     Anti-infectives: Anti-infectives    Start     Dose/Rate Route Frequency Ordered Stop   12/05/15 1800  cefTRIAXone (ROCEPHIN) 1 g in dextrose 5 % 50 mL IVPB     1 g 100 mL/hr over 30 Minutes Intravenous Every 24 hours 12/04/15 2036     12/05/15 0900  ciprofloxacin (CIPRO) IVPB 400 mg    Comments:  Give in IR for surgical prophylaxis   400 mg 200 mL/hr over 60 Minutes Intravenous  Once 12/05/15 0845      12/04/15 1815  cefTRIAXone (ROCEPHIN) 1 g in dextrose 5 % 50 mL IVPB     1 g 100 mL/hr over 30 Minutes Intravenous  Once 12/04/15 1803 12/04/15 1858      Current Facility-Administered Medications  Medication Dose Route Frequency Provider Last Rate Last Dose  . acetaminophen (TYLENOL) tablet 650 mg  650 mg Oral Q6H PRN Bonney AidAlyssa A Haney, MD   650 mg at 12/04/15 2242  . cefTRIAXone (ROCEPHIN) 1 g in dextrose 5 % 50 mL IVPB  1 g Intravenous Q24H Alyssa A Haney, MD      . ciprofloxacin (CIPRO) IVPB 400 mg  400 mg Intravenous Once Gershon CraneWendy Sanders Blair, PA-C      . dextrose 5 %-0.9 % sodium chloride infusion   Intravenous Continuous Bonney AidAlyssa A Haney, MD 75 mL/hr at 12/05/15 0752    . enoxaparin (LOVENOX) injection 40 mg  40 mg Subcutaneous Q24H Bonney AidAlyssa A Haney, MD   40 mg at 12/04/15 2243  . folic acid (FOLVITE) tablet 1 mg  1 mg Oral Daily Alyssa A Haney, MD      . iopamidol (ISOVUE-300) 61 % injection           .  lidocaine (XYLOCAINE) 1 % (with pres) injection           . morphine 2 MG/ML injection 2 mg  2 mg Intravenous Q3H PRN Bonney Aid, MD      . pantoprazole (PROTONIX) EC tablet 40 mg  40 mg Oral Daily Howard Pouch, MD   40 mg at 12/04/15 2345   Past medical, surgical, family and social history reviewed.   Objective: Vital signs in last 24 hours: Temp:  [98 F (36.7 C)-98.4 F (36.9 C)] 98 F (36.7 C) (11/04 0644) Pulse Rate:  [72-102] 72 (11/04 0644) Resp:  [16-18] 16 (11/03 1945) BP: (100-129)/(68-92) 100/68 (11/04 0644) SpO2:  [99 %-100 %] 99 % (11/04 0644) Weight:  [71.7 kg (158 lb)] 71.7 kg (158 lb) (11/04 0600)  Intake/Output from previous day: 11/03 0701 - 11/04 0700 In: 546.3 [I.V.:496.3; IV Piggyback:50] Out: -  Intake/Output this shift: Total I/O In: 188.8 [I.V.:188.8] Out: -    Physical Exam  Constitutional: She is oriented to person, place, and time and well-developed, well-nourished, and in no distress.  HENT:  Head: Normocephalic and atraumatic.  Neck: Normal  range of motion. Neck supple. No thyromegaly present.  Cardiovascular: Normal rate, regular rhythm and normal heart sounds.   Pulmonary/Chest: Effort normal and breath sounds normal. No respiratory distress.  Abdominal: Soft. She exhibits no distension and no mass. There is tenderness (mild RUQ and CVAT). There is no rebound.  Musculoskeletal: Normal range of motion. She exhibits no edema or tenderness.  Lymphadenopathy:    She has no cervical adenopathy.  Neurological: She is alert and oriented to person, place, and time.  Skin: Skin is warm and dry.  Psychiatric: Mood and affect normal.    Lab Results:   Recent Labs  12/04/15 1334 12/05/15 0700  WBC 9.9 10.0  HGB 11.0* 10.1*  HCT 34.0* 32.3*  PLT 432* 384   BMET  Recent Labs  12/04/15 1334 12/05/15 0700  NA 137 137  K 4.1 3.8  CL 104 104  CO2 27 27  GLUCOSE 130* 75  BUN 8 8  CREATININE 1.10* 1.02*  CALCIUM 9.8 9.0   PT/INR  Recent Labs  12/04/15 1853  LABPROT 13.5  INR 1.02   ABG No results for input(s): PHART, HCO3 in the last 72 hours.  Invalid input(s): PCO2, PO2  Studies/Results: Ct Abdomen Pelvis W Contrast  Result Date: 12/04/2015 CLINICAL DATA:  Right-sided abdominal pain for 6-7 months with worsening over the past 2 months. Nausea and diaphoresis. EXAM: CT ABDOMEN AND PELVIS WITH CONTRAST TECHNIQUE: Multidetector CT imaging of the abdomen and pelvis was performed using the standard protocol following bolus administration of intravenous contrast. CONTRAST:  ISOVUE-300 IOPAMIDOL (ISOVUE-300) INJECTION 61% COMPARISON:  None. FINDINGS: Lower chest: Lung bases show no acute findings. Heart size normal. No pericardial effusion. Hepatobiliary: 11 mm low-attenuation lesion in the central aspect of the liver is too small to characterize. Liver and gallbladder are otherwise unremarkable. No biliary ductal dilatation. Pancreas: Negative. Spleen: Negative. Adrenals/Urinary Tract: Adrenal glands are  unremarkable. Severe right hydronephrosis with a staghorn calculus in the right renal pelvis. Separate stones are seen in the upper and lower poles of the right kidney. There is no excretion of contrast from the right kidney on nephrographic phase imaging. Right ureter is decompressed. Left kidney is unremarkable and left ureter is decompressed. Bladder is unremarkable. Stomach/Bowel: Stomach, small bowel, appendix and colon are unremarkable. Vascular/Lymphatic: Vascular structures are unremarkable. Lymph nodes in the renal hila measure up  to 14 mm. Additional retroperitoneal lymph nodes along the course of the infrarenal aorta are sub cm in short axis size. Porta hepatis and abdominal peritoneal ligament lymph nodes are also sub cm in short axis size. Reproductive: Uterus is visualized.  No adnexal mass. Other: No free fluid. Mesenteries and peritoneum are unremarkable. Small periumbilical hernia contains fat. Musculoskeletal: No worrisome lytic or sclerotic lesions. IMPRESSION: 1. 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 2. Enlarged lymph nodes in the right renal hilum, likely reactive. 3. Additional right renal stones. Electronically Signed   By: Leanna BattlesMelinda  Blietz M.D.   On: 12/04/2015 17:21   Case discussed with Dr. Kennon RoundsHaney and the ER physician.  Labs and CT films and report reviewed.   Assessment: Right staghorn stone with obstruction and UTI without sepsis.  For right perc tube placement today and I will schedule a right PCNL for the near future.   Risks of the procedure reviewed including bleeding, infection, renal and adjacent organ injury, need for secondary procedures, renal atrophy or loss, urine leaks, renal fistulae, thrombotic events and anesthetic complications reviewed.  She could be discharged in the morning if she remains afebrile post tube insertion.     CC: Velora HecklerAlyssa Haney  MD     Anner CreteWRENN,[place] Divis J 12/05/2015 [id number]

## 2015-12-05 NOTE — Sedation Documentation (Signed)
Pt is extremely anxious  

## 2015-12-05 NOTE — H&P (Signed)
Chief Complaint: Hydronephrosis Staghorn calculus  Referring Physician(s): Velora HecklerAlyssa Haney  Supervising Physician: Ruel FavorsShick, Trevor  [facility]  History of Present Illness: Tonya Howard is a 40 y.o. female who presented to the ED last night with abdominal pain.   Patient states she has been having RUQ  abdominal pain for the past 6-7 months which has recently worsened over the past 2 weeks.    Urine has been cloudy as well.   Over the last 2 weeks she has noted her pain sometimes her pain radiates to the lower abdomen.    She reports she has been having increased urgency and frequency on urination but denies burning.    She states the pain worsens with eating and heat packs have helped improve the pain somewhat.   She has been to her doctor and was prescribed antibiotics in the past but with no improvement.    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  We are asked to place a nephrostomy tube.    She is NPO. She denies fevers.   She did have Lovenox last night - Dr. Miles CostainShick aware - benefit of proceeding outweighs risk.  Past Medical History:  Diagnosis Date  . Anemia     Past Surgical History:  Procedure Laterality Date  . BREAST SURGERY      Allergies: Shellfish allergy  Medications: Prior to Admission medications   Medication Sig Start Date End Date Taking? Authorizing Provider  folic acid (FOLVITE) 1 MG tablet Take 1 mg by mouth daily.   Yes Historical Provider, MD  Melatonin 10 MG TABS Take 1 tablet by mouth every other day.   Yes Historical Provider, MD  Multiple Vitamin (MULTIVITAMIN WITH MINERALS) TABS tablet Take 1 tablet by mouth daily.   Yes Historical Provider, MD  medroxyPROGESTERone (PROVERA) 10 MG tablet Take 1 tablet (10 mg total) by mouth daily. Use for ten days Patient not taking: Reported on 12/04/2015  10/22/15   Tereso NewcomerUgonna A Anyanwu, MD     Family History  Problem Relation Age of Onset  . Diabetes Father   . Cancer Maternal Grandmother   . Diabetes Paternal Grandfather     Social History   Social History  . Marital status: Single    Spouse name: N/A  . Number of children: N/A  . Years of education: N/A   Social History Main Topics  . Smoking status: Never Smoker  . Smokeless tobacco: Never Used  . Alcohol use Yes  . Drug use: No  . Sexual activity: Yes   Other Topics Concern  . None   Social History Narrative  . None   Review of Systems: A 12 point ROS discussed  Review of Systems  Constitutional: Negative.   HENT: Negative.   Respiratory: Negative.   Cardiovascular: Negative.   Gastrointestinal: Positive for abdominal pain and nausea.  Genitourinary: Positive for dysuria, frequency and urgency.  Musculoskeletal: Negative.   Skin: Negative.   Neurological: Negative.   Hematological: Negative.   Psychiatric/Behavioral: Negative.     Vital Signs: BP 100/68 (BP Location: Left Arm)   Pulse 72   Temp 98 F (36.7 C) (Oral)   Resp 16   Ht 5\' 6"  (1.676 m)   Wt 158 lb (71.7 kg)   LMP 11/30/2015 (Approximate)   SpO2 99%   BMI 25.50 kg/m   Physical Exam  Constitutional:  She is oriented to person, place, and time. She appears well-developed and well-nourished.  HENT:  Head: Normocephalic and atraumatic.  Eyes: EOM are normal.  Neck: Normal range of motion.  Cardiovascular: Normal rate, regular rhythm and normal heart sounds.   No murmur heard. Pulmonary/Chest: Effort normal and breath sounds normal. No respiratory distress. She has no wheezes.  Abdominal: Soft. She exhibits no distension. There is no tenderness.  Musculoskeletal: Normal range of motion.  Neurological: She is alert and oriented to person, place, and time.  Skin: Skin is warm and dry.  Psychiatric: She has a normal mood and affect. Her behavior is normal. Judgment and thought content normal.    Vitals reviewed.   Mallampati Score:  MD Evaluation Airway: WNL Heart: WNL Abdomen: WNL Chest/ Lungs: WNL ASA  Classification: 2 Mallampati/Airway Score: Two  Imaging: Ct Abdomen Pelvis W Contrast  Result Date: 12/04/2015 CLINICAL DATA:  Right-sided abdominal pain for 6-7 months with worsening over the past 2 months. Nausea and diaphoresis. EXAM: CT ABDOMEN AND PELVIS WITH CONTRAST TECHNIQUE: Multidetector CT imaging of the abdomen and pelvis was performed using the standard protocol following bolus administration of intravenous contrast. CONTRAST:  ISOVUE-300 IOPAMIDOL (ISOVUE-300) INJECTION 61% COMPARISON:  None. FINDINGS: Lower chest: Lung bases show no acute findings. Heart size normal. No pericardial effusion. Hepatobiliary: 11 mm low-attenuation lesion in the central aspect of the liver is too small to characterize. Liver and gallbladder are otherwise unremarkable. No biliary ductal dilatation. Pancreas: Negative. Spleen: Negative. Adrenals/Urinary Tract: Adrenal glands are unremarkable. Severe right hydronephrosis with a staghorn calculus in the right renal pelvis. Separate stones are seen in the upper and lower poles of the right kidney. There is no excretion of contrast from the right kidney on nephrographic phase imaging. Right ureter is decompressed. Left kidney is unremarkable and left ureter is decompressed. Bladder is unremarkable. Stomach/Bowel: Stomach, small bowel, appendix and colon are unremarkable. Vascular/Lymphatic: Vascular structures are unremarkable. Lymph nodes in the renal hila measure up to 14 mm. Additional retroperitoneal lymph nodes along the course of the infrarenal aorta are sub cm in short axis size. Porta hepatis and abdominal peritoneal ligament lymph nodes are also sub cm in short axis size. Reproductive: Uterus is visualized.  No adnexal mass. Other: No free fluid. Mesenteries and peritoneum are unremarkable. Small periumbilical hernia contains fat.  Musculoskeletal: No worrisome lytic or sclerotic lesions. IMPRESSION: 1. 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 2. Enlarged lymph nodes in the right renal hilum, likely reactive. 3. Additional right renal stones. Electronically Signed   By: Leanna Battles M.D.   On: 12/04/2015 17:21    Labs:  CBC:  Recent Labs  10/22/15 0957 12/04/15 1334 12/05/15 0700  WBC 8.1 9.9 10.0  HGB 12.2 11.0* 10.1*  HCT 36.2 34.0* 32.3*  PLT 379 432* 384    COAGS:  Recent Labs  12/04/15 1853  INR 1.02  APTT 34    BMP:  Recent Labs  12/04/15 1334 12/05/15 0700  NA 137 137  K 4.1 3.8  CL 104 104  CO2 27 27  GLUCOSE 130* 75  BUN 8 8  CALCIUM 9.8 9.0  CREATININE 1.10* 1.02*  GFRNONAA >60 >60  GFRAA >60 >60    LIVER FUNCTION TESTS:  Recent Labs  12/04/15 1334  BILITOT 0.3  AST 18  ALT 13*  ALKPHOS 81  PROT 8.5*  ALBUMIN 3.3*    TUMOR  MARKERS: No results for input(s): AFPTM, CEA, CA199, CHROMGRNA in the last 8760 hours.  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 Miles CostainShick  Risks and Benefits discussed with the patient including, but not limited to infection, bleeding, significant bleeding causing loss or decrease in renal function or damage to adjacent structures.   All of the patient's questions were answered, patient is agreeable to proceed. Consent signed and in chart.  Thank you for this interesting consult.  I greatly enjoyed meeting Tonya Howard and look forward to participating in their care.  A copy of this report was sent to the requesting provider on this date.  Electronically Signed: Gwynneth MacleodWENDY S Rhyli Depaula PA-C 12/05/2015, 8:46 AM   I spent a total of 40 Minutes in face to face in clinical consultation,  greater than 50% of which was counseling/coordinating care for nephrostomy tube

## 2015-12-05 NOTE — Progress Notes (Signed)
Discussed with pateint what the planned procedure is and why several times. As she has a large obstructing renal stone, per urology she is to have percutaneous nephrostomy placement by IR today to drain urine from her obstructed right kidney. Will follow urology recs. They have been paged

## 2015-12-05 NOTE — Procedures (Signed)
S/p RT 10 FR PCN   No comp Stable Urine cx sent Full report in PACS
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
## 2015-12-05 NOTE — Sedation Documentation (Signed)
Pt remains very anxious about procedure

## 2015-12-05 NOTE — Progress Notes (Signed)
Emptied drain bad to Right nephrostomy tube. Red fluid with what appeared to be tissue. Will continue to monitor. Bess KindsGWALTNEY, Ritu Gagliardo B, RN

## 2015-12-05 NOTE — Sedation Documentation (Signed)
Patient is very anxious 

## 2015-12-05 NOTE — Progress Notes (Signed)
Spoke with Dr Annabell HowellsWrenn who confirmed she will need a perc neph today. He will see her today to discuss further steps. This was relayed to the patient and her mother. This provider apologized for any confusion about the plan that they had

## 2015-12-05 NOTE — Sedation Documentation (Signed)
Vital signs stable. 

## 2015-12-05 NOTE — Sedation Documentation (Signed)
Pt  tolerated procedure well.

## 2015-12-05 NOTE — Plan of Care (Signed)
Problem: Education: Goal: Knowledge of [facility] Education information/materials will improve Outcome: Progressing Patient with questions about pending procedure, including what and why. Will f/u with MD.   Problem: Nutrition: Goal: Adequate nutrition will be maintained Outcome: Progressing Currently NPO pending procedure, Percutaneous Nephrostomy, R staghorn calculus.

## 2015-12-06 DIAGNOSIS — N200 Calculus of kidney: Secondary | ICD-10-CM

## 2015-12-06 LAB — BASIC METABOLIC PANEL
ANION GAP: 6 (ref 5–15)
BUN: 8 mg/dL (ref 6–20)
CALCIUM: 8.9 mg/dL (ref 8.9–10.3)
CO2: 27 mmol/L (ref 22–32)
CREATININE: 1 mg/dL (ref 0.44–1.00)
Chloride: 105 mmol/L (ref 101–111)
GFR calc Af Amer: >60 mL/min (ref >60)
GLUCOSE: 112 mg/dL — AB (ref 65–99)
Potassium: 3.9 mmol/L (ref 3.5–5.1)
Sodium: 138 mmol/L (ref 135–145)

## 2015-12-06 LAB — CBC
HCT: 32.2 % — ABNORMAL LOW (ref 36.0–46.0)
HEMOGLOBIN: 10 g/dL — AB (ref 12.0–15.0)
MCH: 27.9 pg (ref 26.0–34.0)
MCHC: 31.1 g/dL (ref 30.0–36.0)
MCV: 89.7 fL (ref 78.0–100.0)
Platelets: 402 10*3/uL — ABNORMAL HIGH (ref 150–400)
RBC: 3.59 MIL/uL — ABNORMAL LOW (ref 3.87–5.11)
RDW: 13.2 % (ref 11.5–15.5)
WBC: 8.9 10*3/uL (ref 4.0–10.5)

## 2015-12-06 MED ORDER — OXYCODONE-ACETAMINOPHEN 5-325 MG PO TABS: 1.0000 | ORAL_TABLET | Freq: Four times a day (QID) | ORAL | Status: DC | PRN

## 2015-12-06 MED ORDER — DIPHENHYDRAMINE HCL 25 MG PO CAPS
25.0000 mg | ORAL_CAPSULE | Freq: Once | ORAL | Status: AC
  Administered 2015-12-06: 25 mg via ORAL
  Filled 2015-12-06: qty 1

## 2015-12-06 MED ORDER — MORPHINE SULFATE (PF) 2 MG/ML IV SOLN
1.0000 mg | INTRAVENOUS | Status: DC | PRN
  Administered 2015-12-06: 1 mg via INTRAVENOUS
  Filled 2015-12-06: qty 1

## 2015-12-06 MED ORDER — OXYCODONE HCL ER 10 MG PO T12A: 10.0000 mg | EXTENDED_RELEASE_TABLET | Freq: Once | ORAL | Status: DC

## 2015-12-06 MED ORDER — DIPHENHYDRAMINE HCL 25 MG PO CAPS
25.0000 mg | ORAL_CAPSULE | Freq: Three times a day (TID) | ORAL | Status: DC | PRN
  Administered 2015-12-06 – 2015-12-07 (×2): 25 mg via ORAL
  Filled 2015-12-06 (×2): qty 1

## 2015-12-06 MED ORDER — OXYCODONE-ACETAMINOPHEN 5-325 MG PO TABS
1.0000 | ORAL_TABLET | Freq: Four times a day (QID) | ORAL | Status: DC | PRN
  Administered 2015-12-06: 1 via ORAL
  Filled 2015-12-06: qty 1

## 2015-12-06 MED ORDER — OXYCODONE HCL ER 10 MG PO T12A
10.0000 mg | EXTENDED_RELEASE_TABLET | Freq: Once | ORAL | Status: AC
  Administered 2015-12-06: 10 mg via ORAL

## 2015-12-06 MED ORDER — OXYCODONE-ACETAMINOPHEN 5-325 MG PO TABS
1.0000 | ORAL_TABLET | Freq: Four times a day (QID) | ORAL | Status: DC | PRN
  Administered 2015-12-07 (×2): 1 via ORAL
  Filled 2015-12-06 (×2): qty 1

## 2015-12-06 MED ORDER — OXYCODONE HCL ER 10 MG PO T12A
10.0000 mg | EXTENDED_RELEASE_TABLET | Freq: Two times a day (BID) | ORAL | Status: DC
  Administered 2015-12-06 – 2015-12-07 (×2): 10 mg via ORAL
  Filled 2015-12-06 (×3): qty 1

## 2015-12-06 NOTE — Final Consult Note (Signed)
Consultant Final Sign-Off Note    Assessment/Final recommendations  Tonya Howard is a 40 y.o. female followed by me for right staghorn stone with pain and obstruction.  She had a right perc placed yesterday and the tube is draining clear urine.   Her UCx from 11/3 grew Mx Species.  She should be able to be discharged and I will arrange f/u for a percutaneous nephrolithotomy.    Wound care (if applicable):    Diet at discharge: per primary team   Activity at discharge: see discharge instructions.    Follow-up appointment:     Pending results:  Unresulted Labs    Start     Ordered   12/11/15 0500  Creatinine, serum  (enoxaparin (LOVENOX)    CrCl >/= 30 ml/min)  Weekly,   R    Comments:  while on enoxaparin therapy    12/04/15 2036   12/05/15 1037  Urine culture  Once,   R     12/05/15 1037       Medication recommendations:   Other recommendations:    Thank you for allowing us to participate in the care of your patient!  Please consult us again if you have further needs for your patient.  Breeze Berringer J 12/06/2015 7:04 AM    Subjective     Objective  Vital signs in last 24 hours: Temp:  [98 F (36.7 C)-98.2 F (36.8 C)] 98 F (36.7 C) (11/05 0537) Pulse Rate:  [76-92] 83 (11/05 0537) Resp:  [18-22] 18 (11/05 0537) BP: (98-115)/(66-81) 113/74 (11/05 0537) SpO2:  [100 %] 100 % (11/05 0537)  General: Wd, WN BF in NAD Right perc tube draining clear urine.    Pertinent labs and Studies:  Recent Labs  12/04/15 1334 12/05/15 0700 12/06/15 0510  WBC 9.9 10.0 8.9  HGB 11.0* 10.1* 10.0*  HCT 34.0* 32.3* 32.2*   BMET  Recent Labs  12/05/15 0700 12/06/15 0510  NA 137 138  K 3.8 3.9  CL 104 105  CO2 27 27  GLUCOSE 75 112*  BUN 8 8  CREATININE 1.02* 1.00  CALCIUM 9.0 8.9   No results for input(s): LABURIN in the last 72 hours. Results for orders placed or performed during the hospital encounter of 12/04/15  Culture, blood (routine x 2)      Status: None (Preliminary result)   Collection Time: 12/04/15  6:46 PM  Result Value Ref Range Status   Specimen Description BLOOD LEFT ANTECUBITAL  Final   Special Requests BOTTLES DRAWN AEROBIC AND ANAEROBIC 5CC  Final   Culture NO GROWTH < 24 HOURS  Final   Report Status PENDING  Incomplete  Culture, blood (routine x 2)     Status: None (Preliminary result)   Collection Time: 12/04/15  6:53 PM  Result Value Ref Range Status   Specimen Description BLOOD LEFT HAND  Final   Special Requests IN PEDIATRIC BOTTLE 3CC  Final   Culture NO GROWTH < 24 HOURS  Final   Report Status PENDING  Incomplete    Imaging: Ir Nephrostomy Placement Right  Result Date: 12/05/2015 INDICATION: Obstructing infected right kidney secondary to a complex staghorn calculus. Urosepsis. EXAM: IR NEPHROSTOMY PLACEMENT RIGHT COMPARISON:  12/04/2015 CT MEDICATIONS: 400 mg Cipro; The antibiotic was administered in an appropriate time frame prior to skin puncture. ANESTHESIA/SEDATION: Fentanyl 100 mcg IV; Versed 2.0 mg IV Moderate Sedation Time:  10 minutes The patient was continuously monitored during the procedure by the interventional radiology nurse under my direct supervision.  CONTRAST:  20 cc Isovue - administered into the collecting system(s) FLUOROSCOPY TIME:  Fluoroscopy Time:  36 seconds (1 mGy). COMPLICATIONS: None immediate. PROCEDURE: Informed written consent was obtained from the patient after a thorough discussion of the procedural risks, benefits and alternatives. All questions were addressed. Maximal Sterile Barrier Technique was utilized including caps, mask, sterile gowns, sterile gloves, sterile drape, hand hygiene and skin antiseptic. A timeout was performed prior to the initiation of the procedure. Previous imaging reviewed. Patient positioned prone. Preliminary ultrasound performed. The hydronephrotic right kidney containing a staghorn calculus was localized. Under sterile conditions and local anesthesia,  ultrasound percutaneous needle access performed of a dilated mid pole calyx. Needle position confirmed with ultrasound. Images obtained for documentation. Guidewire advanced easily into the upper pole dilated calyx. Accustick dilator set advanced. Amplatz guidewire exchange. Tract dilatation performed to insert a 10 French catheter. Retention loop formed in the dilated collecting system. Contrast injection confirms position. Images obtained for documentation. Catheter secured with a Prolene suture and connected to external gravity drainage. Sterile dressing applied. No immediate complication. Patient tolerated the procedure well. IMPRESSION: Successful ultrasound and fluoroscopic 10 French right nephrostomy insertion. Urine culture sent. Electronically Signed   By: Judie PetitM.  Shick M.D.   On: 12/05/2015 10:49

## 2015-12-06 NOTE — Progress Notes (Signed)
Family Medicine Teaching Service Daily Progress Note Intern Pager: [phone]  Patient name: Tonya Howard Medical record number: [id number] Date of birth: 1975/09/19 Age: 40 y.o. Gender: female  Primary Care Provider: No PCP Per Patient Consultants: Urology Code Status: Full  Pt Overview and Major Events to Date:  11/3: admitted for abdominal pain ad found to have a right renal stone 11/4: s/p Rt 10Fr PCN tube placement   Assessment and Plan: Tonya Howard is a 40 y.o. female presenting with abdominal pain found to have an obstructing right staghorn calculus. No significant PMH.   Renal stone- R Staghorn Calculus s/p Rt PCN tube placement: - Urology consulted, appreciate reccs: R PCNL for the near future, can be discharged in the AM. Discussed with Dr. Annabell HowellsWrenn over phone- unlikely has a UTI (afebrile and no leukocytosis) therefore not inclined not dc with antibiotic. Initial UA with multiple species. Pain is not controlled yet.  - Rocephin (11/3 >). Consider discontinuing  - follow further renal recs - Percocet 5-325 q 6 PRN with IV Morphine PRN for breakthrough   Abdominal pain, resolved- Resolved with heating pad, GI cocktail and tylenol. Could be due to stone vs GERD vs biliary colic, lab evaluation wnl, no evidence of billiary disease on CT scan - tylenol PRN, IV morphine PRN while NPO  UTI- concern for infected stone, no evidence of sepsis, afebrile. UA with multiple species. Repeat UA pending.  Blood cx NG < 24 hours  - Rocephin q24 hr. Consider discontinuing today  - follow further urology recs for abx continuation: note above  - follow UCx, BCx   FEN/GI: Regular diet PPx: lovenox  Disposition: home pending clinical improvement  Subjective:  Nursing progress note mentioned R nephrostomy tube bag with red fluid with what appeared to be tissue yesterday evening. Continues to have pain which patient says is significant. She is unable to move much without having pain.  Difficultly using bedside toilet due to pain.  Objective: Temp:  [98 F (36.7 C)-98.2 F (36.8 C)] 98 F (36.7 C) (11/05 0537) Pulse Rate:  [72-92] 83 (11/05 0537) Resp:  [18-22] 18 (11/05 0537) BP: (98-115)/(66-81) 113/74 (11/05 0537) SpO2:  [99 %-100 %] 100 % (11/05 0537) Physical Exam: General: NAD, lying in bed Cardiovascular: RRR, no murmurs Respiratory: CTAB, normal WOB Abdomen: soft, non tender no rebound or guarding Back: dressing for nephrostomy tube clean and dry. Drainage is non-bloody in collection bag     Laboratory:  Recent Labs Lab 12/04/15 1334 12/05/15 0700 12/06/15 0510  WBC 9.9 10.0 8.9  HGB 11.0* 10.1* 10.0*  HCT 34.0* 32.3* 32.2*  PLT 432* 384 402*    Recent Labs Lab 12/04/15 1334 12/05/15 0700 12/06/15 0510  NA 137 137 138  K 4.1 3.8 3.9  CL 104 104 105  CO2 27 27 27   BUN 8 8 8   CREATININE 1.10* 1.02* 1.00  CALCIUM 9.8 9.0 8.9  PROT 8.5*  --   --   BILITOT 0.3  --   --   ALKPHOS 81  --   --   ALT 13*  --   --   AST 18  --   --   GLUCOSE 130* 75 112*   Blood Cx 11/3: NG < 24 hrs Urine Cx 11/3: multiple species Urine Cx 11/4: pending   Imaging/Diagnostic Tests: CT Abd/Pelvis 11/3: IMPRESSION: 1. 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 2. Enlarged  lymph nodes in the right renal hilum, likely reactive. 3. Additional right renal stones.  Palma HolterKanishka G Monquie Fulgham, MD 12/06/2015, 6:32 AM PGY-2, [facility] FPTS Intern pager: [phone], text pages welcome

## 2015-12-07 ENCOUNTER — Other Ambulatory Visit: Payer: Self-pay | Admitting: Urology

## 2015-12-07 DIAGNOSIS — N200 Calculus of kidney: Secondary | ICD-10-CM

## 2015-12-07 LAB — URINALYSIS, ROUTINE W REFLEX MICROSCOPIC
Bilirubin Urine: NEGATIVE
Glucose, UA: NEGATIVE mg/dL
Ketones, ur: NEGATIVE mg/dL
Nitrite: NEGATIVE
Protein, ur: 100 mg/dL — AB
Specific Gravity, Urine: 1.013 (ref 1.005–1.030)
pH: 6.5 (ref 5.0–8.0)

## 2015-12-07 LAB — BASIC METABOLIC PANEL WITH GFR
Anion gap: 8 (ref 5–15)
BUN: 7 mg/dL (ref 6–20)
CO2: 27 mmol/L (ref 22–32)
Calcium: 9.3 mg/dL (ref 8.9–10.3)
Chloride: 102 mmol/L (ref 101–111)
Creatinine, Ser: 1.01 mg/dL — ABNORMAL HIGH (ref 0.44–1.00)
GFR calc Af Amer: >60 mL/min
GFR calc non Af Amer: >60 mL/min
Glucose, Bld: 92 mg/dL (ref 65–99)
Potassium: 4 mmol/L (ref 3.5–5.1)
Sodium: 137 mmol/L (ref 135–145)

## 2015-12-07 LAB — URINE CULTURE: Culture: 8000 — AB

## 2015-12-07 LAB — CBC
HCT: 32.5 % — ABNORMAL LOW (ref 36.0–46.0)
Hemoglobin: 10.3 g/dL — ABNORMAL LOW (ref 12.0–15.0)
MCH: 28.1 pg (ref 26.0–34.0)
MCHC: 31.7 g/dL (ref 30.0–36.0)
MCV: 88.8 fL (ref 78.0–100.0)
Platelets: 381 K/uL (ref 150–400)
RBC: 3.66 MIL/uL — ABNORMAL LOW (ref 3.87–5.11)
RDW: 13.5 % (ref 11.5–15.5)
WBC: 8.5 K/uL (ref 4.0–10.5)

## 2015-12-07 LAB — URINE MICROSCOPIC-ADD ON

## 2015-12-07 MED ORDER — OXYCODONE HCL ER 10 MG PO T12A: 10.0000 mg | EXTENDED_RELEASE_TABLET | Freq: Two times a day (BID) | ORAL | 0 refills | Status: DC

## 2015-12-07 MED ORDER — CEPHALEXIN 500 MG PO CAPS: 500.0000 mg | ORAL_CAPSULE | Freq: Two times a day (BID) | ORAL | 0 refills | Status: DC

## 2015-12-07 MED ORDER — SENNOSIDES-DOCUSATE SODIUM 8.6-50 MG PO TABS: 1.0000 | ORAL_TABLET | Freq: Every day | ORAL | Status: DC

## 2015-12-07 MED ORDER — OXYCODONE-ACETAMINOPHEN 5-325 MG PO TABS: 1.0000 | ORAL_TABLET | Freq: Four times a day (QID) | ORAL | 0 refills | Status: DC | PRN

## 2015-12-07 MED ORDER — SENNOSIDES-DOCUSATE SODIUM 8.6-50 MG PO TABS: 1.0000 | ORAL_TABLET | Freq: Every day | ORAL | 0 refills | Status: DC

## 2015-12-07 MED ORDER — CEPHALEXIN 500 MG PO CAPS: 500.0000 mg | ORAL_CAPSULE | Freq: Four times a day (QID) | ORAL | 0 refills | Status: DC

## 2015-12-07 NOTE — Care Management Note (Addendum)
Case Management Note  Patient Details  Name: Tonya Howard MRN: [id number] Date of Birth: 10-23-1975  Subjective/Objective:                 Spoke with patient in the room. She declined assistance with PCP. Currently only meds on AVS are pain meds, CM cannot offer assistance with those. Spoke with bedside RN Tonya Howard who stated plan not clear at this time, although DC order in, teaching service looking at cultures and still working on plan. CM left [facility] brochure in office for patient, Tonya Howard Instructed to give this to patient if she changes her mind about PCP help or needing help with meds after DC. CM paged [facility], spoke to Dr Venetia NightAmao, discussed DC plan, and patient's verbalization that she felt she did not know what her plan was after DC. Dr Venetia NightAmao stated she would up to clarify with patient, and patient would be on Keflex costing ~$9 out of pocket.   Action/Plan:  CM signing off.  Expected Discharge Date:                  Expected Discharge Plan:  Home/Self Care  In-House Referral:     Discharge planning Services  CM Consult  Post Acute Care Choice:    Choice offered to:     DME Arranged:    DME Agency:     HH Arranged:    HH Agency:     Status of Service:  Completed, signed off  If discussed at [employer] Length of Stay Meetings, dates discussed:    Additional Comments:  Lawerance SabalDebbie Heinrich Fertig, RN 12/07/2015, 5:10 PM

## 2015-12-07 NOTE — Progress Notes (Signed)
Referring Physician(s): Dr Ranelle OysterJ Wrenn  Supervising Physician: Gilmer MorWagner, Jaime  [facility]  [facility]  Chief Complaint:  Renal stones Rt hydronephrosis  Subjective:  Rt PCN placed 11/4 Output great Blood tinged Painful at site--minimal Labs normalized  Allergies: Shellfish allergy  Medications: Prior to Admission medications   Medication Sig Start Date End Date Taking? Authorizing Provider  folic acid (FOLVITE) 1 MG tablet Take 1 mg by mouth daily.   Yes Historical Provider, MD  Melatonin 10 MG TABS Take 1 tablet by mouth every other day.   Yes Historical Provider, MD  Multiple Vitamin (MULTIVITAMIN WITH MINERALS) TABS tablet Take 1 tablet by mouth daily.   Yes Historical Provider, MD  medroxyPROGESTERone (PROVERA) 10 MG tablet Take 1 tablet (10 mg total) by mouth daily. Use for ten days Patient not taking: Reported on 12/04/2015 10/22/15   Tereso NewcomerUgonna A Anyanwu, MD     Vital Signs: BP 124/81 (BP Location: Left Arm)   Pulse 83   Temp 98.6 F (37 C) (Oral)   Resp 18   Ht 5\' 6"  (1.676 m)   Wt 158 lb (71.7 kg)   LMP 11/30/2015 (Approximate)   SpO2 100%   BMI 25.50 kg/m   Physical Exam  Constitutional: She is oriented to person, place, and time.  Abdominal: Soft.  Musculoskeletal: Normal range of motion.  Neurological: She is alert and oriented to person, place, and time.  Skin: Skin is warm and dry.  Site of Rt PCN is clean and dry Sl tender Output blood tinged 300 cc yesterday; 700 cc today 200 cc in bag  BUN/CR normalized  Psychiatric: She has a normal mood and affect. Her behavior is normal. Judgment and thought content normal.  Nursing note and vitals reviewed.   Imaging: Ct Abdomen Pelvis W Contrast  Result Date: 12/04/2015 CLINICAL DATA:  Right-sided abdominal pain for 6-7 months with worsening over the past 2 months. Nausea and diaphoresis. EXAM: CT ABDOMEN AND PELVIS WITH CONTRAST TECHNIQUE: Multidetector CT imaging of the abdomen and pelvis was  performed using the standard protocol following bolus administration of intravenous contrast. CONTRAST:  100mL ISOVUE-300 IOPAMIDOL (ISOVUE-300) INJECTION 61% COMPARISON:  None. FINDINGS: Lower chest: Lung bases show no acute findings. Heart size normal. No pericardial effusion. Hepatobiliary: 11 mm low-attenuation lesion in the central aspect of the liver is too small to characterize. Liver and gallbladder are otherwise unremarkable. No biliary ductal dilatation. Pancreas: Negative. Spleen: Negative. Adrenals/Urinary Tract: Adrenal glands are unremarkable. Severe right hydronephrosis with a staghorn calculus in the right renal pelvis. Separate stones are seen in the upper and lower poles of the right kidney. There is no excretion of contrast from the right kidney on nephrographic phase imaging. Right ureter is decompressed. Left kidney is unremarkable and left ureter is decompressed. Bladder is unremarkable. Stomach/Bowel: Stomach, small bowel, appendix and colon are unremarkable. Vascular/Lymphatic: Vascular structures are unremarkable. Lymph nodes in the renal hila measure up to 14 mm. Additional retroperitoneal lymph nodes along the course of the infrarenal aorta are sub cm in short axis size. Porta hepatis and abdominal peritoneal ligament lymph nodes are also sub cm in short axis size. Reproductive: Uterus is visualized.  No adnexal mass. Other: No free fluid. Mesenteries and peritoneum are unremarkable. Small periumbilical hernia contains fat. Musculoskeletal: No worrisome lytic or sclerotic lesions. IMPRESSION: 1. 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 2. Enlarged lymph nodes in the  right renal hilum, likely reactive. 3. Additional right renal stones. Electronically Signed   By: Leanna BattlesMelinda  Blietz M.D.   On: 12/04/2015 17:21   Ir Nephrostomy Placement Right  Result Date: 12/05/2015 INDICATION:  Obstructing infected right kidney secondary to a complex staghorn calculus. Urosepsis. EXAM: IR NEPHROSTOMY PLACEMENT RIGHT COMPARISON:  12/04/2015 CT MEDICATIONS: 400 mg Cipro; The antibiotic was administered in an appropriate time frame prior to skin puncture. ANESTHESIA/SEDATION: Fentanyl 100 mcg IV; Versed 2.0 mg IV Moderate Sedation Time:  10 minutes The patient was continuously monitored during the procedure by the interventional radiology nurse under my direct supervision. CONTRAST:  20 cc Isovue - administered into the collecting system(s) FLUOROSCOPY TIME:  Fluoroscopy Time:  36 seconds (1 mGy). COMPLICATIONS: None immediate. PROCEDURE: Informed written consent was obtained from the patient after a thorough discussion of the procedural risks, benefits and alternatives. All questions were addressed. Maximal Sterile Barrier Technique was utilized including caps, mask, sterile gowns, sterile gloves, sterile drape, hand hygiene and skin antiseptic. A timeout was performed prior to the initiation of the procedure. Previous imaging reviewed. Patient positioned prone. Preliminary ultrasound performed. The hydronephrotic right kidney containing a staghorn calculus was localized. Under sterile conditions and local anesthesia, ultrasound percutaneous needle access performed of a dilated mid pole calyx. Needle position confirmed with ultrasound. Images obtained for documentation. Guidewire advanced easily into the upper pole dilated calyx. Accustick dilator set advanced. Amplatz guidewire exchange. Tract dilatation performed to insert a 10 French catheter. Retention loop formed in the dilated collecting system. Contrast injection confirms position. Images obtained for documentation. Catheter secured with a Prolene suture and connected to external gravity drainage. Sterile dressing applied. No immediate complication. Patient tolerated the procedure well. IMPRESSION: Successful ultrasound and fluoroscopic 10 French  right nephrostomy insertion. Urine culture sent. Electronically Signed   By: Judie PetitM.  Shick M.D.   On: 12/05/2015 10:49    Labs:  CBC:  Recent Labs  12/04/15 1334 12/05/15 0700 12/06/15 0510 12/07/15 0625  WBC 9.9 10.0 8.9 8.5  HGB 11.0* 10.1* 10.0* 10.3*  HCT 34.0* 32.3* 32.2* 32.5*  PLT 432* 384 402* 381    COAGS:  Recent Labs  12/04/15 1853  INR 1.02  APTT 34    BMP:  Recent Labs  12/04/15 1334 12/05/15 0700 12/06/15 0510 12/07/15 0625  NA 137 137 138 137  K 4.1 3.8 3.9 4.0  CL 104 104 105 102  CO2 27 27 27 27   GLUCOSE 130* 75 112* 92  BUN 8 8 8 7   CALCIUM 9.8 9.0 8.9 9.3  CREATININE 1.10* 1.02* 1.00 1.01*  GFRNONAA >60 >60 >60 >60  GFRAA >60 >60 >60 >60    LIVER FUNCTION TESTS:  Recent Labs  12/04/15 1334  BILITOT 0.3  AST 18  ALT 13*  ALKPHOS 81  PROT 8.5*  ALBUMIN 3.3*    Assessment and Plan:  Rt renal stones Rt Hydronephrosis Rt PCN placed 11/4 Doing well Plan per Dr Annabell HowellsWrenn  Electronically Signed: Ralene MuskratURPIN,Louisa Favaro A 12/07/2015, 1:00 PM   I spent a total of 15 Minutes at the the patient's bedside AND on the patient's hospital floor or unit, greater than 50% of which was counseling/coordinating care for Rt PCN

## 2015-12-07 NOTE — Progress Notes (Signed)
Encouraged the pt to allow her dressing to be changed.  Pt declined and stated if she is able to be discharged today, she will allow the AM nurse to change it due to the discomfort experienced while its being changed. Dressing is dry, clean, and intact.  Will continue to monitor the pt.

## 2015-12-07 NOTE — Progress Notes (Signed)
Patient discharge instruction reviewed with daughter and mother. Patient verbalized understanding. Prescription for narcotics given to patient and abx called in the pharmacy. Drain care instructions given. And patient emptied drain prior to leaving and demonstrated drain care.

## 2015-12-07 NOTE — Progress Notes (Signed)
FMTS Attending Daily Note:  Levert FeinsteinBrittany Barney Russomanno MD Personal pager:  [phone] FPTS Service Pager:  [phone]  S: Patient doing well today. Having pain, though it is manageable with pain medication. She is concerned that nephrostomy tube is draining blood liquid. Urine has also been blood tinged. Agreeable to going home today if no further inpatient workup is necessary, but wants firm outpatient follow up plans in place.  O: BP 124/81 (BP Location: Left Arm)   Pulse 83   Temp 98.6 F (37 C) (Oral)   Resp 18   Ht 5\' 6"  (1.676 m)   Wt 158 lb (71.7 kg)   LMP 11/30/2015 (Approximate)   SpO2 100%   BMI 25.50 kg/m   Exam: Gen NAD, pleasant, well appearing Back: nephrostomy drain dressing clean/dry/intact. Drain is producing serosanginous fluid. Abdomen: soft, nontender to palpation  Heart regular rate and rhythm   A/P:  1. Staghorn calculus in R kidney - doing well with percutaneous nephrostomy. Some serosanginous drainage - resident team spoke with urology and per them, this is to be expected. No further inpatient workup is planned by urology. Will follow up outpatient for procedural management of stone. Notably nephrostomy fluid grew proteus - known to cause staghorn calculi. Will discharge on antibiotics with close scheduled follow up with urology.   Will sign resident progress note when it is available.   Latrelle DodrillBrittany J Niara Bunker, MD 12/07/2015

## 2015-12-07 NOTE — Evaluation (Signed)
Physical Therapy Evaluation Patient Details Name: Tonya RibasRhonda L Coghill MRN: [id number] DOB: 13-Jun-1975 Today's Date: 12/07/2015   History of Present Illness  Tonya RibasRhonda L Bourdon is a 40 y.o. female presenting with abdominal pain found to have an obstructing right staghorn calculus. No significant PMH. R nephrostomy tube placed  Clinical Impression  Patient evaluated by Physical Therapy with no further acute PT needs identified. All education has been completed and the patient has no further questions. Movement very painful for pt but RW did help her tolerate ambulation, recommend for d/c home. Advised pt to ambulate at least 3x/ day. Pt able to get in and out of bed without assist despite pain.  See below for any follow-up Physical Therapy or equipment needs. PT is signing off. Thank you for this referral.     Follow Up Recommendations No PT follow up    Equipment Recommendations  Rolling walker with 5" wheels    Recommendations for Other Services       Precautions / Restrictions Precautions Precautions: None Restrictions Weight Bearing Restrictions: No      Mobility  Bed Mobility Overal bed mobility: Modified Independent             General bed mobility comments: needs increased time but no physical assist. Discussed various methods to decrease pain  Transfers Overall transfer level: Modified independent Equipment used: None             General transfer comment: no difficulty with transfers  Ambulation/Gait Ambulation/Gait assistance: Modified independent (Device/Increase time) Ambulation Distance (Feet): 60 Feet Assistive device: Rolling walker (2 wheeled);None Gait Pattern/deviations: Step-through pattern;Decreased stride length Gait velocity: decreased Gait velocity interpretation: <1.8 ft/sec, indicative of risk for recurrent falls General Gait Details: pt with slow, guarded gait. Able to ambulate without RW but 10/10 pain, pain was decreased with RW and would  highly recommend using while she recovers  Stairs            Wheelchair Mobility    Modified Rankin (Stroke Patients Only)       Balance Overall balance assessment: No apparent balance deficits (not formally assessed)                                           Pertinent Vitals/Pain Pain Assessment: Faces Faces Pain Scale: Hurts worst Pain Location: right back Pain Descriptors / Indicators: Aching Pain Intervention(s): Limited activity within patient's tolerance;Monitored during session;Premedicated before session    Home Living Family/patient expects to be discharged to:: Private residence Living Arrangements: Parent Available Help at Discharge: Family;Available 24 hours/day Type of Home: House Home Access: Stairs to enter   Enter[employer] CorporationEntrance Stairs-Number of Steps: 3 Home Layout: One level Home Equipment: None      Prior Function Level of Independence: Independent               Hand Dominance        Extremity/Trunk Assessment   Upper Extremity Assessment: Overall WFL for tasks assessed           Lower Extremity Assessment: Overall WFL for tasks assessed      Cervical / Trunk Assessment: Normal  Communication   Communication: No difficulties  Cognition Arousal/Alertness: Awake/alert Behavior During Therapy: WFL for tasks assessed/performed Overall Cognitive Status: Within Functional Limits for tasks assessed  General Comments General comments (skin integrity, edema, etc.): discussed activity level upon return home to prevent muscle wasting    Exercises     Assessment/Plan    PT Assessment Patent does not need any further PT services  PT Problem List            PT Treatment Interventions      PT Goals (Current goals can be found in the Care Plan section)  Acute Rehab PT Goals Patient Stated Goal: decreased pain PT Goal Formulation: All assessment and education complete, DC therapy     Frequency     Barriers to discharge        Co-evaluation               End of Session   Activity Tolerance: Patient limited by pain Patient left: in bed;with call bell/phone within reach;with family/visitor present Nurse Communication: Mobility status         Time: [id number] PT Time Calculation (min) (ACUTE ONLY): 15 min   Charges:   PT Evaluation $PT Eval Low Complexity: 1 Procedure     PT G Codes:      Lyanne CoVictoria Makaleigh Reinard, PT  Acute Rehab Services  [phone]   Lyanne CoManess, Omunique Pederson 12/07/2015, 1:41 PM

## 2015-12-08 NOTE — Discharge Summary (Signed)
Family Medicine Teaching [facility] Discharge Summary  Patient name: Tonya Howard Medical record number: [id number] Date of birth: Apr 05, 1975 Age: 40 y.o. Gender: female Date of Admission: 12/04/2015  Date of Discharge: 12/07/15 Admitting Physician: Tonya RampSara L Neal, MD  Primary Care Provider: No PCP Per Patient Consultants: Urology, Interventional Radiology  Indication for Hospitalization: Right staghorn renal calculus  Discharge Diagnoses/Problem List:  Right staghorn renal calculus Pan-sensitive Proteus mirabilis bacteriuria/nephrolithiasis  Disposition: Home  Discharge Condition: Stable, improved  Discharge Exam:  General: well nourished, well developed, in no acute distress with non-toxic appearance HEENT: normocephalic, atraumatic, moist mucous membranes CV: regular rate and rhythm without murmurs, rubs, or gallops Lungs: clear to auscultation bilaterally with normal work of breathing Abdomen: soft, normoactive bowel sounds GU: mildly tender around right nephrostomy site without erythema or edema, red-tinged urine Skin: warm, dry, no rashes or lesions, cap refill < 2 seconds Extremities: warm and well perfused, normal tone  Brief Hospital Course:  Tonya Howard a 40 y.o.female who presented with abdominal painfound to have an obstructing right staghorn calculus. No significant PMH.  Patient experiencing RUQ abdominal pain for 6-7 months worsening over the course of 2 weeks. Other symptoms included urinary urgency and polyuria. CT abd/pelvis showed obstructing staghorn calculus in the right renal pelvis with severe right hydronephrosis. Urology was consulted who agreed to place a stent after IR placed a percutaneous nephrostomy which was successfully performed. Patient remained afebrile and stable. Urine culture initially grew multiple species and repeat culture grew 8,000 species pan-sensitive Proteus mirabilis. The patient also noted red-tinged urine the day of  discharge and this was discussed with urology who noted this as a normal response to the procedure and recommended antibiotics until follow up. Patient was discharge on Keflex with Urology follow up.  Issues for Follow Up:  1. Complete Keflex 500 mg QID for infectious nephrolithiasis through 12/17/15 2. Patient was given oxycodone 10 mg tablets and instructed to use BID PRN for pain and Percocet 5-325 q6h for breakthrough pain 3. Urology follow up scheduled 12/15/15 for percutaneous nephrolithotomy and possible stent placement  Significant Procedures: Right percutaneous nephrostomy  Significant Labs and Imaging:   Recent Labs Lab 12/05/15 0700 12/06/15 0510 12/07/15 0625  WBC 10.0 8.9 8.5  HGB 10.1* 10.0* 10.3*  HCT 32.3* 32.2* 32.5*  PLT 384 402* 381    Recent Labs Lab 12/04/15 1334 12/05/15 0700 12/06/15 0510 12/07/15 0625  NA 137 137 138 137  K 4.1 3.8 3.9 4.0  CL 104 104 105 102  CO2 27 27 27 27   GLUCOSE 130* 75 112* 92  BUN 8 8 8 7   CREATININE 1.10* 1.02* 1.00 1.01*  CALCIUM 9.8 9.0 8.9 9.3  ALKPHOS 81  --   --   --   AST 18  --   --   --   ALT 13*  --   --   --   ALBUMIN 3.3*  --   --   --    Lipase: 48 B-hCG: neg UA/micro (11/03): turbid, mod Hgb, 100 protein, large leuk, many bacteria UA/micro (11/06): cloudy, large Hgb, 100 protein, large leuk, few bacteria UCx (11/03): multiple species present UCx (11/04): 8,000 colonies proteus mirabilis, pan-sensitive BCx: neg  Results/Tests Pending at Time of Discharge: None  Discharge Medications:    Medication List    TAKE these medications   cephALEXin 500 MG capsule Commonly known as:  KEFLEX Take 1 capsule (500 mg total) by mouth 4 (four) times daily.  folic acid 1 MG tablet Commonly known as:  FOLVITE Take 1 mg by mouth daily.   medroxyPROGESTERone 10 MG tablet Commonly known as:  PROVERA Take 1 tablet (10 mg total) by mouth daily. Use for ten days   Melatonin 10 MG Tabs Take 1 tablet by mouth  every other day.   multivitamin with minerals Tabs tablet Take 1 tablet by mouth daily.   oxyCODONE 10 mg 12 hr tablet Commonly known as:  OXYCONTIN Take 1 tablet (10 mg total) by mouth 2 (two) times daily.   oxyCODONE-acetaminophen 5-325 MG tablet Commonly known as:  PERCOCET/ROXICET Take 1 tablet by mouth every 6 (six) hours as needed (breakthrough pain).   senna-docusate 8.6-50 MG tablet Commonly known as:  Senokot-S Take 1 tablet by mouth at bedtime.       Discharge Instructions: Please refer to Patient Instructions section of EMR for full details.  Patient was counseled important signs and symptoms that should prompt return to medical care, changes in medications, dietary instructions, activity restrictions, and follow up appointments.   Follow-Up Appointments: Follow-up Information    Tonya CreteWRENN,JOHN J, MD. Go on 12/15/2015.   Specialty:  Urology Why:  My office will call to arrange next procedure.  If you have not heard by Weds, please contact Tonya Howard in my office.  Contact information: [address] [place] [address] [phone]           Tonya Beaversavid Howard Arrionna Serena, DO 12/08/2015, 6:59 PM PGY-1, [facility]

## 2015-12-08 NOTE — Progress Notes (Signed)
Family Medicine Teaching Service Daily Progress Note Intern Pager: [phone]  Patient name: Tonya RibasRhonda L Charland Medical record number: [id number] Date of birth: 1975-04-10 Age: 40 y.o. Gender: female  Primary Care Provider: No PCP Per Patient Consultants: Urology Code Status: Full  Pt Overview and Major Events to Date:  11/3: admitted for abdominal pain ad found to have a right renal stone 11/4: s/p Rt 10Fr PCN tube placement  11/6: UCx 11/4 showed pan-sensitive proteus, initiated keflex  Assessment and Plan: Tonya Howard is a 40 y.o. female presenting with abdominal pain found to have an obstructing right staghorn calculus. No significant PMH.   #Renal stone- R Staghorn Calculus s/p Rt PCN tube placement: Urology following. Discussed initial positive UCx and urology did not rec abx for d/c. Second UCx showed proteus mirabilis 8,000 colonies pan-sensitive. Discussed with urology who rec pt be given keflex until f/u. - Urology consulted, appreciate reccs, s/p R PCNL, can be discharged in the AM. Blood in urine is not abnormal following nephrostomy and should not keep patient per Urology recs. - follow further renal recs - Percocet 5-325 q 6 PRN with IV Morphine PRN for breakthrough   #Abdominal pain, resolved: Resolved with heating pad, GI cocktail and tylenol. Could be due to stone vs GERD vs biliary colic, lab evaluation wnl, no evidence of billiary disease on CT scan - tylenol PRN, IV morphine PRN while NPO  #UTI: Concern for infected stone, no evidence of sepsis, afebrile. UA with multiple species. Repeat UA pending. Blood cx NG < 24 hours. UCx 11/04 showed proteus mirabilis 8,000 colonies pan-sensitive. - Will initiate Keflex for d/c, this was discussed with Urology who felt patient was safe for d/c with f/u on 11/14 - follow further urology recs for abx continuation: note above  - follow UCx, BCx   FEN/GI: Regular diet PPx: lovenox  Disposition: improved pain level and no  fevers, clear from urology standpoint, will d/c 11/6  Subjective:  Patient mentioned R nephrostomy tube bag with red fluid. Continues to have pain near nephrostomy site.  Objective:   Physical Exam: General: NAD, lying in bed Cardiovascular: RRR, no murmurs Respiratory: CTAB, normal WOB Abdomen: soft, non tender no rebound or guarding Back: dressing for nephrostomy tube clean and dry. Drainage is red-tinged in collection bag     Laboratory:  Recent Labs Lab 12/05/15 0700 12/06/15 0510 12/07/15 0625  WBC 10.0 8.9 8.5  HGB 10.1* 10.0* 10.3*  HCT 32.3* 32.2* 32.5*  PLT 384 402* 381    Recent Labs Lab 12/04/15 1334 12/05/15 0700 12/06/15 0510 12/07/15 0625  NA 137 137 138 137  K 4.1 3.8 3.9 4.0  CL 104 104 105 102  CO2 27 27 27 27   BUN 8 8 8 7   CREATININE 1.10* 1.02* 1.00 1.01*  CALCIUM 9.8 9.0 8.9 9.3  PROT 8.5*  --   --   --   BILITOT 0.3  --   --   --   ALKPHOS 81  --   --   --   ALT 13*  --   --   --   AST 18  --   --   --   GLUCOSE 130* 75 112* 92   Blood Cx 11/3: NG < 24 hrs Urine Cx 11/3: multiple species Urine Cx 11/4: pending   Imaging/Diagnostic Tests: CT Abd/Pelvis 11/3: IMPRESSION: 1. 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 2.  Enlarged lymph nodes in the right renal hilum, likely reactive. 3. Additional right renal stones.  Wendee Beaversavid J Kacy Hegna, DO 12/08/2015, 1:50 PM PGY-2, [place] FPTS Intern pager: [phone], text pages welcome

## 2015-12-08 NOTE — Progress Notes (Signed)
Pt is being scheduled for preop appt; please place surgical orders in epic. Thanks.  

## 2015-12-09 LAB — CULTURE, BLOOD (ROUTINE X 2)
Culture: NO GROWTH
Culture: NO GROWTH

## 2015-12-10 ENCOUNTER — Encounter (HOSPITAL_COMMUNITY): Payer: Self-pay | Admitting: *Deleted

## 2015-12-14 NOTE — Anesthesia Preprocedure Evaluation (Addendum)
Anesthesia Evaluation  Patient identified by MRN, date of birth, ID band Patient awake    Reviewed: Allergy & Precautions, NPO status , Patient's Chart, lab work & pertinent test results  Airway Mallampati: II  TM Distance: >3 FB Neck ROM: Full    Dental no notable dental hx. (+) Dental Advisory Given   Pulmonary neg pulmonary ROS,    Pulmonary exam normal        Cardiovascular negative cardio ROS Normal cardiovascular exam     Neuro/Psych negative neurological ROS  negative psych ROS   GI/Hepatic negative GI ROS, Neg liver ROS,   Endo/Other  negative endocrine ROS  Renal/GU   negative genitourinary   Musculoskeletal negative musculoskeletal ROS (+)   Abdominal   Peds negative pediatric ROS (+)  Hematology negative hematology ROS (+)   Anesthesia Other Findings   Reproductive/Obstetrics negative OB ROS                            Anesthesia Physical Anesthesia Plan  ASA: II  Anesthesia Plan: General   Post-op Pain Management:    Induction: Intravenous  Airway Management Planned: LMA  Additional Equipment:   Intra-op Plan:   Post-operative Plan: Extubation in OR  Informed Consent: I have reviewed the patients History and Physical, chart, labs and discussed the procedure including the risks, benefits and alternatives for the proposed anesthesia with the patient or authorized representative who has indicated his/her understanding and acceptance.   Dental advisory given  Plan Discussed with: Anesthesiologist and CRNA  Anesthesia Plan Comments:        Anesthesia Quick Evaluation

## 2015-12-15 ENCOUNTER — Ambulatory Visit (HOSPITAL_COMMUNITY): Payer: Self-pay | Admitting: Anesthesiology

## 2015-12-15 ENCOUNTER — Encounter (HOSPITAL_COMMUNITY)
Admission: RE | Disposition: A | Discharge status: Home / Self Care | Payer: Self-pay | Source: Ambulatory Visit | Attending: Urology

## 2015-12-15 ENCOUNTER — Ambulatory Visit (HOSPITAL_COMMUNITY): Payer: Self-pay

## 2015-12-15 ENCOUNTER — Encounter (HOSPITAL_COMMUNITY): Payer: Self-pay | Admitting: *Deleted

## 2015-12-15 ENCOUNTER — Observation Stay (HOSPITAL_COMMUNITY)
Admission: RE | Admit: 2015-12-15 | Discharge: 2015-12-16 | Disposition: A | Discharge status: Home / Self Care | Payer: Self-pay | Source: Ambulatory Visit | Attending: Urology | Admitting: Urology

## 2015-12-15 DIAGNOSIS — Z833 Family history of diabetes mellitus: Secondary | ICD-10-CM | POA: Insufficient documentation

## 2015-12-15 DIAGNOSIS — B964 Proteus (mirabilis) (morganii) as the cause of diseases classified elsewhere: Secondary | ICD-10-CM | POA: Insufficient documentation

## 2015-12-15 DIAGNOSIS — N200 Calculus of kidney: Secondary | ICD-10-CM

## 2015-12-15 DIAGNOSIS — N390 Urinary tract infection, site not specified: Secondary | ICD-10-CM | POA: Insufficient documentation

## 2015-12-15 DIAGNOSIS — Z87442 Personal history of urinary calculi: Secondary | ICD-10-CM | POA: Insufficient documentation

## 2015-12-15 DIAGNOSIS — Z91013 Allergy to seafood: Secondary | ICD-10-CM | POA: Insufficient documentation

## 2015-12-15 DIAGNOSIS — N132 Hydronephrosis with renal and ureteral calculous obstruction: Principal | ICD-10-CM | POA: Insufficient documentation

## 2015-12-15 DIAGNOSIS — Z809 Family history of malignant neoplasm, unspecified: Secondary | ICD-10-CM | POA: Insufficient documentation

## 2015-12-15 HISTORY — DX: Urinary calculus, unspecified: N20.9

## 2015-12-15 HISTORY — PX: NEPHROLITHOTOMY: SHX5134

## 2015-12-15 LAB — ABO/RH: ABO/RH(D): B POS

## 2015-12-15 LAB — HEMOGLOBIN AND HEMATOCRIT, BLOOD
HCT: 32.2 % — ABNORMAL LOW (ref 36.0–46.0)
HEMOGLOBIN: 10.3 g/dL — AB (ref 12.0–15.0)

## 2015-12-15 SURGERY — NEPHROLITHOTOMY PERCUTANEOUS
Anesthesia: General | Laterality: Right

## 2015-12-15 MED ORDER — HYDROMORPHONE HCL 1 MG/ML IJ SOLN
0.2500 mg | INTRAMUSCULAR | Status: DC | PRN
  Administered 2015-12-15 (×3): 0.5 mg via INTRAVENOUS

## 2015-12-15 MED ORDER — OXYCODONE HCL ER 10 MG PO T12A
10.0000 mg | EXTENDED_RELEASE_TABLET | Freq: Two times a day (BID) | ORAL | Status: DC
  Administered 2015-12-15 – 2015-12-16 (×2): 10 mg via ORAL
  Filled 2015-12-15 (×2): qty 1

## 2015-12-15 MED ORDER — SUGAMMADEX SODIUM 200 MG/2ML IV SOLN
INTRAVENOUS | Status: AC
  Filled 2015-12-15: qty 2

## 2015-12-15 MED ORDER — FENTANYL CITRATE (PF) 100 MCG/2ML IJ SOLN
INTRAMUSCULAR | Status: AC
  Filled 2015-12-15: qty 2

## 2015-12-15 MED ORDER — ROCURONIUM BROMIDE 10 MG/ML (PF) SYRINGE
PREFILLED_SYRINGE | INTRAVENOUS | Status: DC | PRN
  Administered 2015-12-15: 10 mg via INTRAVENOUS
  Administered 2015-12-15: 5 mg via INTRAVENOUS
  Administered 2015-12-15: 35 mg via INTRAVENOUS

## 2015-12-15 MED ORDER — LIDOCAINE 2% (20 MG/ML) 5 ML SYRINGE
INTRAMUSCULAR | Status: DC | PRN
  Administered 2015-12-15: 100 mg via INTRAVENOUS

## 2015-12-15 MED ORDER — BISACODYL 10 MG RE SUPP: 10.0000 mg | Freq: Every day | RECTAL | Status: DC | PRN

## 2015-12-15 MED ORDER — HYDROMORPHONE HCL 1 MG/ML IJ SOLN
INTRAMUSCULAR | Status: AC
  Filled 2015-12-15: qty 1

## 2015-12-15 MED ORDER — ONDANSETRON HCL 4 MG/2ML IJ SOLN
INTRAMUSCULAR | Status: DC | PRN
  Administered 2015-12-15: 4 mg via INTRAVENOUS

## 2015-12-15 MED ORDER — PROMETHAZINE HCL 25 MG/ML IJ SOLN: 6.2500 mg | INTRAMUSCULAR | Status: DC | PRN

## 2015-12-15 MED ORDER — SODIUM CHLORIDE 0.9 % IR SOLN
Status: DC | PRN
  Administered 2015-12-15: 18000 mL

## 2015-12-15 MED ORDER — FENTANYL CITRATE (PF) 100 MCG/2ML IJ SOLN
INTRAMUSCULAR | Status: DC | PRN
  Administered 2015-12-15: 100 ug via INTRAVENOUS
  Administered 2015-12-15 (×4): 50 ug via INTRAVENOUS

## 2015-12-15 MED ORDER — PROPOFOL 10 MG/ML IV BOLUS
INTRAVENOUS | Status: DC | PRN
  Administered 2015-12-15: 180 mg via INTRAVENOUS

## 2015-12-15 MED ORDER — LACTATED RINGERS IV SOLN
INTRAVENOUS | Status: DC | PRN
  Administered 2015-12-15 (×2): via INTRAVENOUS

## 2015-12-15 MED ORDER — DIPHENHYDRAMINE HCL 12.5 MG/5ML PO ELIX
12.5000 mg | ORAL_SOLUTION | Freq: Four times a day (QID) | ORAL | Status: DC | PRN
  Administered 2015-12-15 (×2): 25 mg via ORAL
  Filled 2015-12-15 (×2): qty 10

## 2015-12-15 MED ORDER — OXYCODONE-ACETAMINOPHEN 5-325 MG PO TABS
1.0000 | ORAL_TABLET | Freq: Four times a day (QID) | ORAL | Status: DC | PRN
  Administered 2015-12-15 – 2015-12-16 (×3): 1 via ORAL
  Filled 2015-12-15 (×3): qty 1

## 2015-12-15 MED ORDER — PROPOFOL 10 MG/ML IV BOLUS
INTRAVENOUS | Status: AC
  Filled 2015-12-15: qty 20

## 2015-12-15 MED ORDER — MIDAZOLAM HCL 2 MG/2ML IJ SOLN
INTRAMUSCULAR | Status: AC
  Filled 2015-12-15: qty 2

## 2015-12-15 MED ORDER — DEXAMETHASONE SODIUM PHOSPHATE 10 MG/ML IJ SOLN
INTRAMUSCULAR | Status: DC | PRN
  Administered 2015-12-15: 10 mg via INTRAVENOUS

## 2015-12-15 MED ORDER — SENNOSIDES-DOCUSATE SODIUM 8.6-50 MG PO TABS
1.0000 | ORAL_TABLET | Freq: Every day | ORAL | Status: DC
  Administered 2015-12-15: 1 via ORAL
  Filled 2015-12-15: qty 1

## 2015-12-15 MED ORDER — HYDROMORPHONE HCL 1 MG/ML IJ SOLN
0.5000 mg | INTRAMUSCULAR | Status: DC | PRN
  Administered 2015-12-15 – 2015-12-16 (×7): 1 mg via INTRAVENOUS
  Filled 2015-12-15 (×7): qty 1

## 2015-12-15 MED ORDER — MELATONIN 10 MG PO TABS: 10.0000 mg | ORAL_TABLET | Freq: Every evening | ORAL | Status: DC | PRN

## 2015-12-15 MED ORDER — CEPHALEXIN 500 MG PO CAPS
500.0000 mg | ORAL_CAPSULE | Freq: Four times a day (QID) | ORAL | Status: DC
  Administered 2015-12-15 – 2015-12-16 (×5): 500 mg via ORAL
  Filled 2015-12-15 (×5): qty 1

## 2015-12-15 MED ORDER — ONDANSETRON HCL 4 MG/2ML IJ SOLN
INTRAMUSCULAR | Status: AC
  Filled 2015-12-15: qty 2

## 2015-12-15 MED ORDER — FLEET ENEMA 7-19 GM/118ML RE ENEM: 1.0000 | ENEMA | Freq: Once | RECTAL | Status: DC | PRN

## 2015-12-15 MED ORDER — LACTATED RINGERS IV SOLN: INTRAVENOUS | Status: DC

## 2015-12-15 MED ORDER — SUCCINYLCHOLINE CHLORIDE 200 MG/10ML IV SOSY
PREFILLED_SYRINGE | INTRAVENOUS | Status: DC | PRN
  Administered 2015-12-15: 100 mg via INTRAVENOUS

## 2015-12-15 MED ORDER — KCL IN DEXTROSE-NACL 20-5-0.45 MEQ/L-%-% IV SOLN
INTRAVENOUS | Status: DC
  Administered 2015-12-15 – 2015-12-16 (×3): via INTRAVENOUS
  Filled 2015-12-15 (×4): qty 1000

## 2015-12-15 MED ORDER — ZOLPIDEM TARTRATE 5 MG PO TABS: 5.0000 mg | ORAL_TABLET | Freq: Every evening | ORAL | Status: DC | PRN

## 2015-12-15 MED ORDER — SODIUM CHLORIDE 0.9 % IV SOLN
INTRAVENOUS | Status: DC | PRN
  Administered 2015-12-15: 10 mL

## 2015-12-15 MED ORDER — LIDOCAINE 2% (20 MG/ML) 5 ML SYRINGE
INTRAMUSCULAR | Status: AC
  Filled 2015-12-15: qty 5

## 2015-12-15 MED ORDER — MIDAZOLAM HCL 5 MG/5ML IJ SOLN
INTRAMUSCULAR | Status: DC | PRN
Start: 2015-12-15 — End: 2015-12-15
  Administered 2015-12-15: 2 mg via INTRAVENOUS

## 2015-12-15 MED ORDER — CEFAZOLIN SODIUM-DEXTROSE 2-4 GM/100ML-% IV SOLN
INTRAVENOUS | Status: AC
  Filled 2015-12-15: qty 100

## 2015-12-15 MED ORDER — DIPHENHYDRAMINE HCL 50 MG/ML IJ SOLN
12.5000 mg | Freq: Four times a day (QID) | INTRAMUSCULAR | Status: DC | PRN
  Administered 2015-12-15 – 2015-12-16 (×2): 25 mg via INTRAVENOUS
  Filled 2015-12-15 (×2): qty 1

## 2015-12-15 MED ORDER — ROCURONIUM BROMIDE 50 MG/5ML IV SOSY
PREFILLED_SYRINGE | INTRAVENOUS | Status: AC
  Filled 2015-12-15: qty 5

## 2015-12-15 MED ORDER — ACETAMINOPHEN 325 MG PO TABS: 650.0000 mg | ORAL_TABLET | ORAL | Status: DC | PRN

## 2015-12-15 MED ORDER — HYOSCYAMINE SULFATE 0.125 MG SL SUBL
0.1250 mg | SUBLINGUAL_TABLET | SUBLINGUAL | Status: DC | PRN
  Administered 2015-12-15 (×2): 0.125 mg via SUBLINGUAL
  Filled 2015-12-15 (×4): qty 1

## 2015-12-15 MED ORDER — DEXAMETHASONE SODIUM PHOSPHATE 10 MG/ML IJ SOLN
INTRAMUSCULAR | Status: AC
  Filled 2015-12-15: qty 1

## 2015-12-15 MED ORDER — ONDANSETRON HCL 4 MG/2ML IJ SOLN
4.0000 mg | INTRAMUSCULAR | Status: DC | PRN
  Filled 2015-12-15: qty 2

## 2015-12-15 MED ORDER — CEFAZOLIN SODIUM-DEXTROSE 2-4 GM/100ML-% IV SOLN
2.0000 g | INTRAVENOUS | Status: AC
  Administered 2015-12-15: 2 g via INTRAVENOUS
  Filled 2015-12-15: qty 100

## 2015-12-15 MED ORDER — SUGAMMADEX SODIUM 200 MG/2ML IV SOLN
INTRAVENOUS | Status: DC | PRN
  Administered 2015-12-15: 150 mg via INTRAVENOUS

## 2015-12-15 SURGICAL SUPPLY — 56 items
APL SKNCLS STERI-STRIP NONHPOA (GAUZE/BANDAGES/DRESSINGS)
BAG URINE DRAINAGE (UROLOGICAL SUPPLIES) ×3 IMPLANT
BASKET ZERO TIP NITINOL 2.4FR (BASKET) ×1 IMPLANT
BENZOIN TINCTURE PRP APPL 2/3 (GAUZE/BANDAGES/DRESSINGS) ×4 IMPLANT
BLADE SURG 15 STRL LF DISP TIS (BLADE) ×1 IMPLANT
BLADE SURG 15 STRL SS (BLADE) ×3
BSKT STON RTRVL ZERO TP 2.4FR (BASKET)
CARTRIDGE STONEBREAK CO2 KIDNE (ELECTROSURGICAL) IMPLANT
CATCHER STONE W/TUBE ADAPTER (UROLOGICAL SUPPLIES) IMPLANT
CATH AINSWORTH 30CC 24FR (CATHETERS) ×3 IMPLANT
CATH DILATION NEPHR BALL 15X10 (CATHETERS) ×2 IMPLANT
CATH FOLEY 2WAY SLVR  5CC 18FR (CATHETERS) ×2
CATH FOLEY 2WAY SLVR 5CC 18FR (CATHETERS) IMPLANT
CATH ROBINSON RED A/P 20FR (CATHETERS) IMPLANT
CATH URET 5FR 28IN OPEN ENDED (CATHETERS) IMPLANT
CATH URET DUAL LUMEN 6-10FR 50 (CATHETERS) ×3 IMPLANT
CATH UROLOGY TORQUE 40 (MISCELLANEOUS) ×2 IMPLANT
CATH X-FORCE N30 NEPHROSTOMY (TUBING) ×3 IMPLANT
COVER SURGICAL LIGHT HANDLE (MISCELLANEOUS) ×3 IMPLANT
DRAPE C-ARM 42X120 X-RAY (DRAPES) ×3 IMPLANT
DRAPE LINGEMAN PERC (DRAPES) ×3 IMPLANT
DRAPE SURG IRRIG POUCH 19X23 (DRAPES) ×3 IMPLANT
DRSG PAD ABDOMINAL 8X10 ST (GAUZE/BANDAGES/DRESSINGS) ×2 IMPLANT
DRSG TEGADERM 8X12 (GAUZE/BANDAGES/DRESSINGS) ×6 IMPLANT
FIBER LASER FLEXIVA 1000 (UROLOGICAL SUPPLIES) IMPLANT
FIBER LASER FLEXIVA 365 (UROLOGICAL SUPPLIES) IMPLANT
FIBER LASER FLEXIVA 550 (UROLOGICAL SUPPLIES) IMPLANT
FIBER LASER TRAC TIP (UROLOGICAL SUPPLIES) IMPLANT
GAUZE SPONGE 4X4 12PLY STRL (GAUZE/BANDAGES/DRESSINGS) IMPLANT
GLOVE SURG SS PI 8.0 STRL IVOR (GLOVE) ×2 IMPLANT
GOWN STRL REUS W/TWL XL LVL3 (GOWN DISPOSABLE) ×3 IMPLANT
GUIDEWIRE AMPLAZ .035X145 (WIRE) ×6 IMPLANT
GUIDEWIRE STR DUAL SENSOR (WIRE) ×2 IMPLANT
HOLDER FOLEY CATH W/STRAP (MISCELLANEOUS) ×2 IMPLANT
KIT BASIN OR (CUSTOM PROCEDURE TRAY) ×3 IMPLANT
MANIFOLD NEPTUNE II (INSTRUMENTS) ×3 IMPLANT
MASK EYE SHIELD (GAUZE/BANDAGES/DRESSINGS) ×3 IMPLANT
NS IRRIG 1000ML POUR BTL (IV SOLUTION) ×3 IMPLANT
PACK CYSTO (CUSTOM PROCEDURE TRAY) ×3 IMPLANT
PAD ABD 8X10 STRL (GAUZE/BANDAGES/DRESSINGS) ×4 IMPLANT
PROBE KIDNEY STONEBRKR 2.0X425 (ELECTROSURGICAL) ×2 IMPLANT
PROBE LITHOCLAST ULTRA 3.8X403 (UROLOGICAL SUPPLIES) ×2 IMPLANT
PROBE PNEUMATIC 1.0MMX570MM (UROLOGICAL SUPPLIES) IMPLANT
SET IRRIG Y TYPE TUR BLADDER L (SET/KITS/TRAYS/PACK) ×1 IMPLANT
SPONGE LAP 4X18 X RAY DECT (DISPOSABLE) ×3 IMPLANT
STONE CATCHER W/TUBE ADAPTER (UROLOGICAL SUPPLIES) ×3 IMPLANT
SUT SILK 2 0 30  PSL (SUTURE) ×2
SUT SILK 2 0 30 PSL (SUTURE) ×1 IMPLANT
SYR 10ML LL (SYRINGE) ×3 IMPLANT
SYR 20CC LL (SYRINGE) ×4 IMPLANT
TOWEL OR 17X26 10 PK STRL BLUE (TOWEL DISPOSABLE) ×3 IMPLANT
TOWEL OR NON WOVEN STRL DISP B (DISPOSABLE) ×3 IMPLANT
TRAY FOLEY W/METER SILVER 16FR (SET/KITS/TRAYS/PACK) IMPLANT
TUBING CONNECTING 10 (TUBING) ×5 IMPLANT
TUBING CONNECTING 10' (TUBING) ×2
WATER STERILE IRR 500ML POUR (IV SOLUTION) ×2 IMPLANT

## 2015-12-15 NOTE — Anesthesia Procedure Notes (Signed)
Procedure Name: Intubation Date/Time: 12/15/2015 7:42 AM Performed by: Enriqueta ShutterWILLIFORD, Lorena Clearman D Pre-anesthesia Checklist: Patient identified, Emergency Drugs available, Suction available and Patient being monitored Patient Re-evaluated:Patient Re-evaluated prior to inductionOxygen Delivery Method: Circle system utilized Preoxygenation: Pre-oxygenation with 100% oxygen Intubation Type: IV induction Ventilation: Mask ventilation without difficulty Laryngoscope Size: Mac and 3 Grade View: Grade II Tube type: Oral Tube size: 7.5 mm Number of attempts: 1 Airway Equipment and Method: Stylet Placement Confirmation: ETT inserted through vocal cords under direct vision,  positive ETCO2 and breath sounds checked- equal and bilateral Secured at: 21 cm Tube secured with: Tape Dental Injury: Teeth and Oropharynx as per pre-operative assessment

## 2015-12-15 NOTE — Op Note (Signed)
NAMSunny Schlein:  Ferrara, Sanya              ACCOUNT NO.:  [id number]  MEDICAL RECORD NO.:  [id number]  LOCATION:  [facility]                         [facility]  PHYSICIAN:  Excell SeltzerJohn J. Annabell HowellsWrenn, M.D.    DATE OF BIRTH:  [birth year]  DATE OF PROCEDURE:  12/15/2015 DATE OF DISCHARGE:                              OPERATIVE REPORT   PROCEDURES:  Right percutaneous nephrolithotomy for greater than 2 cm stone and right antegrade nephrostogram with interpretation.  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  SURGEON:  Excell SeltzerJohn J. Annabell HowellsWrenn, M.D.  ANESTHESIA:  General.  SPECIMEN:  Stone fragments.  DRAINS:  A 14-French Foley catheter, 24-French Ainsworth nephrostomy tube, and 6-French Kumpe nephrostomy tube.  BLOOD LOSS:  Approximately 50 mL.  COMPLICATIONS:  None.  INDICATIONS:  Bjorn LoserRhonda is a 40 year old African American female, who was admitted to the hospital on 11/03 for abdominal pain.  She was found to have an obstructing right staghorn stone with sepsis and a nephrostomy tube was placed.  She returns now for definitive therapy.  She has been on Keflex for her Proteus UTI.  FINDINGS AND PROCEDURE:  She was taken to the operating room where general anesthetic was induced on the holding room stretcher.  A 14- French Foley catheter was placed and 18 would not pass.  She was then rolled prone on chest rolls on the operating table and was fitted with PAS hose.  Her nephrostomy site was prepped with Hibiclens and then she was draped in the usual sterile fashion.  The loop nephrostomy tube was then wired out under fluoroscopy and a Kumpe catheter was used to negotiate a wire to the bladder.  The dual- lumen catheter was then placed over that wire and a 2nd safety wire was placed to the bladder.  The incision was then enlarged with a knife to 2 cm and the nephrostomy balloon was then passed to the renal pelvis, it  was inflated to 20 atmospheres, and the sheath was advanced.  The balloon was deflated and removed.  The rigid nephroscope was then passed into the renal pelvis.  Initially, the fairly matrix-like purulent material was identified.  This was aspirated out with the ultrasonic lithotrite.  Eventually renal pelvic stone was identified.  This was fragmented and evacuated using the lithotrite.  The stones appeared to be most consistent with struvite. Upper pole calyceal stones were then fragmented and evacuated with the ultrasonic lithotrite and finally a large component of the stone and an anterior calyx was identified and fragmented and removed.  This calyx had a fair amount of the matrix C purulent material that was also aspirated out with the ultrasonic suction.  After all significant fragments were identified, all that were left were some very tiny flecks of stone.  The rigid nephroscope was removed and the kidney was then inspected with a flexible nephroscope.  No significant residual fragments were identified either visually or fluoroscopically.  The nephroscope was removed and a 24-French Ainsworth catheter was passed over the working wire through the sheath into the renal pelvis. The sheath was backed out.  The balloon was filled with  3 mL of sterile water.  A 6-French Kumpe catheter was then passed over the safety wire to just above the bladder.  The Kumpe and nephrostomy catheters were secured to the skin with a 2-0 silk suture.  The wires were removed and an antegrade nephrostogram was performed through the 24-French catheter.  The antegrade nephrostogram revealed good placement of the nephrostomy tube.  There was no extravasation.  There was some clot in the collecting system.  No other abnormalities were noted.  At this point, the Kumpe catheter was capped.  The nephrostomy tube was placed to straight drainage.  The drapes were removed.  A dressing was applied.  The  patient was then rolled back supine on the recovery room stretcher.  Her anesthetic was reversed and she was moved to recovery room in stable condition.  There were no complications.     Excell SeltzerJohn J. Annabell HowellsWrenn, M.D.     JJW/MEDQ  D:  12/15/2015  T:  12/15/2015  Job:  [id number]

## 2015-12-15 NOTE — Interval H&P Note (Signed)
History and Physical Interval Note:  She has done well with the tube and is to have the 1st stage PCNL today.   12/15/2015 7:25 AM  Tonya Howard  has presented today for surgery, with the diagnosis of right  staghorn stone  The various methods of treatment have been discussed with the patient and family. After consideration of risks, benefits and other options for treatment, the patient has consented to  Procedure(s): NEPHROLITHOTOMY PERCUTANEOUS FIRST STAGE (Right) HOLMIUM LASER APPLICATION (Right) as a surgical intervention .  The patient's history has been reviewed, patient examined, no change in status, stable for surgery.  I have reviewed the patient's chart and labs.  Questions were answered to the patient's satisfaction.     [name]

## 2015-12-15 NOTE — Discharge Instructions (Signed)
Percutaneous Nephrolithotomy, Care After °Introduction °Refer to this sheet in the next few weeks. These instructions provide you with information about caring for yourself after your procedure. Your health care provider may also give you more specific instructions. Your treatment has been planned according to current medical practices, but problems sometimes occur. Call your health care provider if you have any problems or questions after your procedure. °What can I expect after the procedure? °After the procedure, it is common to have: °· Soreness or pain. °· Fatigue. °· Some blood in your urine for a few days. °Follow these instructions at home: °Incision care °· Follow instructions from your health care provider about how to take care of your cut from surgery (incision). Make sure you: °¨ Wash your hands with soap and water before you change your bandage (dressing). If soap and water are not available, use hand sanitizer. °¨ Change your dressing as told by your health care provider. °¨ Leave stitches (sutures), skin glue, or adhesive strips in place. These skin closures may need to stay in place for 2 weeks or longer. If adhesive strip edges start to loosen and curl up, you may trim the loose edges. Do not remove adhesive strips completely unless your health care provider tells you to do that. °· Check your incision area every day for signs of infection. Check for: °¨ More redness, swelling, or pain. °¨ More fluid or blood. °¨ Warmth. °¨ Pus or a bad smell. °Activity °· Avoid strenuous activities for as long as told by your health care provider. °· Return to your normal activities as told by your health care provider. Ask your health care provider what activities are safe for you. °General instructions °· Take over-the-counter and prescription medicines only as told by your health care provider. °· Do not drive or operate heavy machinery while taking prescription pain medicine. °· Keep all follow-up visits as  told by your health care provider. This is important. °· You may have been sent home with a catheter or kidney drain tube. If so, carefully follow your health care provider’s instructions on how to take care of your catheter or kidney drain tube. °Contact a health care provider if: °· You have a fever. °· You have more redness, swelling, or pain around your incision. °· You have more fluid or blood coming from your incision. °· Your incision feels warm to the touch. °· You have pus or a bad smell coming from your incision. °· You lose your appetite. °· You feel nauseous or you vomit. °Get help right away if: °· You have blood clots in your urine. °· You cannot urinate. °· You have chest pain or trouble breathing. °This information is not intended to replace advice given to you by your health care provider. Make sure you discuss any questions you have with your health care provider. °Document Released: 02/13/2015 Document Revised: 06/25/2015 Document Reviewed: 07/07/2014 °© 2017 Elsevier ° °

## 2015-12-15 NOTE — Care Management Note (Signed)
Case Management Note  Patient Details  Name: Izora RibasRhonda L Jenison MRN: [id number] Date of Birth: April 14, 1975  Subjective/Objective: 40 y/o f admitted w/R staghorn calculis. S/p R PCN. From home.                   Action/Plan:d/c home.   Expected Discharge Date:                  Expected Discharge Plan:  Home/Self Care  In-House Referral:     Discharge planning Services  CM Consult  Post Acute Care Choice:    Choice offered to:     DME Arranged:    DME Agency:     HH Arranged:    HH Agency:     Status of Service:  In process, will continue to follow  If discussed at Long Length of Stay Meetings, dates discussed:    Additional Comments:  Lanier ClamMahabir, Tison Leibold, RN 12/15/2015, 11:39 AM

## 2015-12-15 NOTE — Anesthesia Postprocedure Evaluation (Signed)
Anesthesia Post Note  Patient: Tonya RibasRhonda L Correia  Procedure(s) Performed: Procedure(s) (LRB): NEPHROLITHOTOMY PERCUTANEOUS FIRST STAGE (Right)  Patient location during evaluation: PACU Anesthesia Type: General Level of consciousness: sedated Pain management: pain level controlled Vital Signs Assessment: post-procedure vital signs reviewed and stable Respiratory status: spontaneous breathing and respiratory function stable Cardiovascular status: stable Anesthetic complications: no    Last Vitals:  Vitals:   12/15/15 1015 12/15/15 1030  BP: (!) 119/95 118/86  Pulse: 74 83  Resp: 12 14  Temp: 36.5 C 36.6 C    Last Pain:  Vitals:   12/15/15 1122  TempSrc:   PainSc: 6                  Micheal Murad DANIEL

## 2015-12-15 NOTE — Brief Op Note (Signed)
12/15/2015  8:59 AM  PATIENT:  Tonya Howard  40 y.o. female  PRE-OPERATIVE DIAGNOSIS:  right  staghorn stone >2cm  POST-OPERATIVE DIAGNOSIS:  right  staghorn stone> 2cm PROCEDURE:  Procedure(s): NEPHROLITHOTOMY PERCUTANEOUS FIRST STAGE (Right) >2cm Right Antegrade Nephrostogram with interp.  SURGEON:  Surgeon(s) and Role:    * Bjorn PippinJohn Damone Fancher, MD - Primary  PHYSICIAN ASSISTANT:   ASSISTANTS: none   ANESTHESIA:   general  EBL:  Total I/O In: 1000 [I.V.:1000] Out: 150 [Urine:100; Blood:50]  BLOOD ADMINISTERED:none  DRAINS: Urinary Catheter (Foley) and 24 fr and 536fr right nephrostomy tubes   LOCAL MEDICATIONS USED:  NONE  SPECIMEN:  Source of Specimen:  right renal stone  DISPOSITION OF SPECIMEN:  PATHOLOGY  COUNTS:  YES  TOURNIQUET:  * No tourniquets in log *  DICTATION: .Other Dictation: Dictation Number [id number]  PLAN OF CARE: Admit for overnight observation  PATIENT DISPOSITION:  PACU - hemodynamically stable.   Delay start of Pharmacological VTE agent (>24hrs) due to surgical blood loss or risk of bleeding: yes

## 2015-12-15 NOTE — Progress Notes (Signed)
PHARMACIST - PHYSICIAN ORDER COMMUNICATION  CONCERNING: P&T Medication Policy on Herbal Medications  DESCRIPTION:  This patient's order for:  Melatonin  has been noted.  This product(s) is classified as an "herbal" or natural product. Due to a lack of definitive safety studies or FDA approval, nonstandard manufacturing practices, plus the potential risk of unknown drug-drug interactions while on inpatient medications, the Pharmacy and Therapeutics Committee does not permit the use of "herbal" or natural products of this type within [facility].   ACTION TAKEN: The pharmacy department is unable to verify this order at this time and your patient has been informed of this safety policy. Please reevaluate patient's clinical condition at discharge and address if the herbal or natural product(s) should be resumed at that time.  Lynann Beaverhristine Betzayda Braxton PharmD, BCPS Pager [phone] 12/15/2015 10:40 AM

## 2015-12-15 NOTE — Transfer of Care (Signed)
Immediate Anesthesia Transfer of Care Note  Patient: Tonya Howard  Procedure(s) Performed: Procedure(s): NEPHROLITHOTOMY PERCUTANEOUS FIRST STAGE (Right)  Patient Location: PACU  Anesthesia Type:General  Level of Consciousness: awake, alert  and oriented  Airway & Oxygen Therapy: Patient Spontanous Breathing and Patient connected to face mask oxygen  Post-op Assessment: Report given to RN and Post -op Vital signs reviewed and stable  Post vital signs: Reviewed and stable  Last Vitals:  Vitals:   12/15/15 0548  BP: 127/77  Pulse: 86  Resp: 18  Temp: 36.8 C    Last Pain:  Vitals:   12/15/15 0613  TempSrc:   PainSc: 3       Patients Stated Pain Goal: 4 (12/15/15 16100613)  Complications: No apparent anesthesia complications

## 2015-12-15 NOTE — H&P (View-Only) (Signed)
Subjective: CC: Right flank pain.  Hx:  I was asked to see Tonya Howard in consultation by Dr. Kennon RoundsHaney for a right staghorn stone with obstruction and UTI.   Tonya Howard had the onset about 6 months ago of some right flank discomfort.  Over the last month Tonya Howard has had increased pain and was treated by her PCP for 2 UTI's.   Tonya Howard has had no hematuria but has had cloudy urine.  Yesterday the pain became more acute and was associated with nausea and vomiting but no fever.   A CT in the ER shows an obstructing right staghorn stone with additional right renal stones.   Her UA looks infected but Tonya Howard is afebrile without a leukocytosis.   Cultures are pending.  ROS:  Review of Systems  All other systems reviewed and are negative.   Allergies  Allergen Reactions  . Shellfish Allergy Hives and Itching    Past Medical History:  Diagnosis Date  . Anemia     Past Surgical History:  Procedure Laterality Date  . BREAST SURGERY      Social History   Social History  . Marital status: Single    Spouse name: N/A  . Number of children: N/A  . Years of education: N/A   Occupational History  . Not on file.   Social History Main Topics  . Smoking status: Never Smoker  . Smokeless tobacco: Never Used  . Alcohol use Yes  . Drug use: No  . Sexual activity: Yes   Other Topics Concern  . Not on file   Social History Narrative  . No narrative on file    Family History  Problem Relation Age of Onset  . Diabetes Father   . Cancer Maternal Grandmother   . Diabetes Paternal Grandfather     Anti-infectives: Anti-infectives    Start     Dose/Rate Route Frequency Ordered Stop   12/05/15 1800  cefTRIAXone (ROCEPHIN) 1 g in dextrose 5 % 50 mL IVPB     1 g 100 mL/hr over 30 Minutes Intravenous Every 24 hours 12/04/15 2036     12/05/15 0900  ciprofloxacin (CIPRO) IVPB 400 mg    Comments:  Give in IR for surgical prophylaxis   400 mg 200 mL/hr over 60 Minutes Intravenous  Once 12/05/15 0845      12/04/15 1815  cefTRIAXone (ROCEPHIN) 1 g in dextrose 5 % 50 mL IVPB     1 g 100 mL/hr over 30 Minutes Intravenous  Once 12/04/15 1803 12/04/15 1858      Current Facility-Administered Medications  Medication Dose Route Frequency Provider Last Rate Last Dose  . acetaminophen (TYLENOL) tablet 650 mg  650 mg Oral Q6H PRN Bonney AidAlyssa A Haney, MD   650 mg at 12/04/15 2242  . cefTRIAXone (ROCEPHIN) 1 g in dextrose 5 % 50 mL IVPB  1 g Intravenous Q24H Alyssa A Haney, MD      . ciprofloxacin (CIPRO) IVPB 400 mg  400 mg Intravenous Once Gershon CraneWendy Sanders Blair, PA-C      . dextrose 5 %-0.9 % sodium chloride infusion   Intravenous Continuous Bonney AidAlyssa A Haney, MD 75 mL/hr at 12/05/15 0752    . enoxaparin (LOVENOX) injection 40 mg  40 mg Subcutaneous Q24H Bonney AidAlyssa A Haney, MD   40 mg at 12/04/15 2243  . folic acid (FOLVITE) tablet 1 mg  1 mg Oral Daily Alyssa A Haney, MD      . iopamidol (ISOVUE-300) 61 % injection           .  lidocaine (XYLOCAINE) 1 % (with pres) injection           . morphine 2 MG/ML injection 2 mg  2 mg Intravenous Q3H PRN Bonney Aid, MD      . pantoprazole (PROTONIX) EC tablet 40 mg  40 mg Oral Daily Howard Pouch, MD   40 mg at 12/04/15 2345   Past medical, surgical, family and social history reviewed.   Objective: Vital signs in last 24 hours: Temp:  [98 F (36.7 C)-98.4 F (36.9 C)] 98 F (36.7 C) (11/04 0644) Pulse Rate:  [72-102] 72 (11/04 0644) Resp:  [16-18] 16 (11/03 1945) BP: (100-129)/(68-92) 100/68 (11/04 0644) SpO2:  [99 %-100 %] 99 % (11/04 0644) Weight:  [71.7 kg (158 lb)] 71.7 kg (158 lb) (11/04 0600)  Intake/Output from previous day: 11/03 0701 - 11/04 0700 In: 546.3 [I.V.:496.3; IV Piggyback:50] Out: -  Intake/Output this shift: Total I/O In: 188.8 [I.V.:188.8] Out: -    Physical Exam  Constitutional: Tonya Howard is oriented to person, place, and time and well-developed, well-nourished, and in no distress.  HENT:  Head: Normocephalic and atraumatic.  Neck: Normal  range of motion. Neck supple. No thyromegaly present.  Cardiovascular: Normal rate, regular rhythm and normal heart sounds.   Pulmonary/Chest: Effort normal and breath sounds normal. No respiratory distress.  Abdominal: Soft. Tonya Howard exhibits no distension and no mass. There is tenderness (mild RUQ and CVAT). There is no rebound.  Musculoskeletal: Normal range of motion. Tonya Howard exhibits no edema or tenderness.  Lymphadenopathy:    Tonya Howard has no cervical adenopathy.  Neurological: Tonya Howard is alert and oriented to person, place, and time.  Skin: Skin is warm and dry.  Psychiatric: Mood and affect normal.    Lab Results:   Recent Labs  12/04/15 1334 12/05/15 0700  WBC 9.9 10.0  HGB 11.0* 10.1*  HCT 34.0* 32.3*  PLT 432* 384   BMET  Recent Labs  12/04/15 1334 12/05/15 0700  NA 137 137  K 4.1 3.8  CL 104 104  CO2 27 27  GLUCOSE 130* 75  BUN 8 8  CREATININE 1.10* 1.02*  CALCIUM 9.8 9.0   PT/INR  Recent Labs  12/04/15 1853  LABPROT 13.5  INR 1.02   ABG No results for input(s): PHART, HCO3 in the last 72 hours.  Invalid input(s): PCO2, PO2  Studies/Results: Ct Abdomen Pelvis W Contrast  Result Date: 12/04/2015 CLINICAL DATA:  Right-sided abdominal pain for 6-7 months with worsening over the past 2 months. Nausea and diaphoresis. EXAM: CT ABDOMEN AND PELVIS WITH CONTRAST TECHNIQUE: Multidetector CT imaging of the abdomen and pelvis was performed using the standard protocol following bolus administration of intravenous contrast. CONTRAST:  ISOVUE-300 IOPAMIDOL (ISOVUE-300) INJECTION 61% COMPARISON:  None. FINDINGS: Lower chest: Lung bases show no acute findings. Heart size normal. No pericardial effusion. Hepatobiliary: 11 mm low-attenuation lesion in the central aspect of the liver is too small to characterize. Liver and gallbladder are otherwise unremarkable. No biliary ductal dilatation. Pancreas: Negative. Spleen: Negative. Adrenals/Urinary Tract: Adrenal glands are  unremarkable. Severe right hydronephrosis with a staghorn calculus in the right renal pelvis. Separate stones are seen in the upper and lower poles of the right kidney. There is no excretion of contrast from the right kidney on nephrographic phase imaging. Right ureter is decompressed. Left kidney is unremarkable and left ureter is decompressed. Bladder is unremarkable. Stomach/Bowel: Stomach, small bowel, appendix and colon are unremarkable. Vascular/Lymphatic: Vascular structures are unremarkable. Lymph nodes in the renal hila measure up  to 14 mm. Additional retroperitoneal lymph nodes along the course of the infrarenal aorta are sub cm in short axis size. Porta hepatis and abdominal peritoneal ligament lymph nodes are also sub cm in short axis size. Reproductive: Uterus is visualized.  No adnexal mass. Other: No free fluid. Mesenteries and peritoneum are unremarkable. Small periumbilical hernia contains fat. Musculoskeletal: No worrisome lytic or sclerotic lesions. IMPRESSION: 1. 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 2. Enlarged lymph nodes in the right renal hilum, likely reactive. 3. Additional right renal stones. Electronically Signed   By: Leanna BattlesMelinda  Blietz M.D.   On: 12/04/2015 17:21   Case discussed with Dr. Kennon RoundsHaney and the ER physician.  Labs and CT films and report reviewed.   Assessment: Right staghorn stone with obstruction and UTI without sepsis.  For right perc tube placement today and I will schedule a right PCNL for the near future.   Risks of the procedure reviewed including bleeding, infection, renal and adjacent organ injury, need for secondary procedures, renal atrophy or loss, urine leaks, renal fistulae, thrombotic events and anesthetic complications reviewed.  Tonya Howard could be discharged in the morning if Tonya Howard remains afebrile post tube insertion.     CC: Velora HecklerAlyssa Haney  MD     Anner CreteWRENN,Amyria Komar J 12/05/2015 [id number]

## 2015-12-16 ENCOUNTER — Observation Stay (HOSPITAL_COMMUNITY): Payer: Self-pay

## 2015-12-16 LAB — HEMOGLOBIN AND HEMATOCRIT, BLOOD
HCT: 29.6 % — ABNORMAL LOW (ref 36.0–46.0)
Hemoglobin: 9.6 g/dL — ABNORMAL LOW (ref 12.0–15.0)

## 2015-12-16 MED ORDER — CEPHALEXIN 500 MG PO CAPS: 500.0000 mg | ORAL_CAPSULE | Freq: Every day | ORAL | 0 refills | Status: AC

## 2015-12-16 MED ORDER — OXYCODONE-ACETAMINOPHEN 5-325 MG PO TABS: 1.0000 | ORAL_TABLET | Freq: Four times a day (QID) | ORAL | 0 refills | Status: DC | PRN

## 2015-12-16 NOTE — Care Management Note (Signed)
Case Management Note  Patient Details  Name: Tonya Howard MRN: [id number] Date of Birth: 11/06/1975  Subjective/Objective:                    Action/Plan:d/c home no needs or orders.   Expected Discharge Date:                  Expected Discharge Plan:  Home/Self Care  In-House Referral:     Discharge planning Services  CM Consult  Post Acute Care Choice:    Choice offered to:     DME Arranged:    DME Agency:     HH Arranged:    HH Agency:     Status of Service:  Completed, signed off  If discussed at Microsoft[employer] Length of Stay Meetings, dates discussed:    Additional Comments:  Lanier ClamMahabir, Jarrin Staley, RN 12/16/2015, 1:37 PM

## 2015-12-16 NOTE — Progress Notes (Signed)
1 Day Post-Op   Assessment and Plan: 1. CT shows only a small tiny RLP fragment that doesn't need a second look.   I will d/c the foley and cap the NT and if she remains pain free, I will d/c the tube later today.  Subjective: She is doing well post op with minimal pain and clearing urine in both tubes.  ROS:  Review of Systems  Constitutional: Negative for chills and fever.  Respiratory: Negative for shortness of breath.   Cardiovascular: Negative for chest pain.  Gastrointestinal: Positive for abdominal pain.    Anti-infectives: Anti-infectives    Start     Dose/Rate Route Frequency Ordered Stop   12/15/15 1200  cephALEXin (KEFLEX) capsule 500 mg     500 mg Oral Every 6 hours 12/15/15 1017     12/15/15 0547  ceFAZolin (ANCEF) IVPB 2g/100 mL premix     2 g 200 mL/hr over 30 Minutes Intravenous 30 min pre-op 12/15/15 0547 12/15/15 0744      Current Facility-Administered Medications  Medication Dose Route Frequency Provider Last Rate Last Dose  . acetaminophen (TYLENOL) tablet 650 mg  650 mg Oral Q4H PRN Bjorn PippinJohn Colston Pyle, MD      . bisacodyl (DULCOLAX) suppository 10 mg  10 mg Rectal Daily PRN Bjorn PippinJohn Zayveon Raschke, MD      . cephALEXin [facility](KEFLEX) capsule 500 mg  500 mg Oral Q6H Bjorn PippinJohn Marcell Chavarin, MD   500 mg at 12/16/15 [id number]  . dextrose 5 % and 0.45 % NaCl with KCl 20 mEq/Howard infusion   Intravenous Continuous Bjorn PippinJohn Lucella Pommier, MD 100 mL/hr at 12/16/15 0806    . diphenhydrAMINE (BENADRYL) injection 12.5-25 mg  12.5-25 mg Intravenous Q6H PRN Bjorn PippinJohn Kamauri Denardo, MD   25 mg at 12/16/15 [id number]   Or  . diphenhydrAMINE (BENADRYL) 12.5 MG/5ML elixir 12.5-25 mg  12.5-25 mg Oral Q6H PRN Bjorn PippinJohn Jenniger Figiel, MD   25 mg at 12/15/15 1717  . HYDROmorphone (DILAUDID) injection 0.5-1 mg  0.5-1 mg Intravenous Q2H PRN Bjorn PippinJohn Sidonia Nutter, MD   1 mg at 12/16/15 0420  . hyoscyamine (LEVSIN SL) SL tablet 0.125 mg  0.125 mg Sublingual Q4H PRN Bjorn PippinJohn Regie Bunner, MD   0.125 mg at 12/15/15 1951  . ondansetron (ZOFRAN) injection 4 mg  4 mg Intravenous Q4H PRN Bjorn PippinJohn Mela Perham,  MD      . oxyCODONE (OXYCONTIN) 12 hr tablet 10 mg  10 mg Oral BID Bjorn PippinJohn Nykolas Bacallao, MD   10 mg at 12/15/15 2220  . oxyCODONE-acetaminophen (PERCOCET/ROXICET) 5-325 MG per tablet 1 tablet  1 tablet Oral Q6H PRN Bjorn PippinJohn Alandis Bluemel, MD   1 tablet at 12/16/15 [phone]  . senna-docusate (Senokot-S) tablet 1 tablet  1 tablet Oral QHS Bjorn PippinJohn Landree Fernholz, MD   1 tablet at 12/15/15 2220  . sodium phosphate (FLEET) 7-19 GM/118ML enema 1 enema  1 enema Rectal Once PRN Bjorn PippinJohn Bijon Mineer, MD      . zolpidem [facility](AMBIEN) tablet 5 mg  5 mg Oral QHS PRN Bjorn PippinJohn Owenn Rothermel, MD         Objective: Vital signs in last 24 hours: Temp:  [97.7 F (36.5 C)-98 F (36.7 C)] 98 F (36.7 C) (11/15 0655) Pulse Rate:  [66-95] 72 (11/15 0655) Resp:  [8-16] 14 (11/15 0655) BP: (107-134)/(59-95) 113/59 (11/15 0655) SpO2:  [97 %-100 %] 99 % (11/15 0655) Weight:  [76.5 kg (168 lb 11.2 oz)] 76.5 kg (168 lb 11.2 oz) (11/14 1030)  Intake/Output from previous day: 11/14 0701 - 11/15 0700 In: 3783.3 [P.O.:360; I.V.:3423.3] Out: 3160 [Urine:3110; Blood:50] Intake/Output  this shift: No intake/output data recorded.   Physical Exam  Constitutional: She is well-developed, well-nourished, and in no distress.  Cardiovascular: Normal rate and regular rhythm.   Pulmonary/Chest: Effort normal and breath sounds normal. No respiratory distress.  Abdominal: Soft. There is no tenderness.  Vitals reviewed.   Lab Results:   Recent Labs  12/15/15 0953 12/16/15 0357  HGB 10.3* 9.6*  HCT 32.2* 29.6*   BMET No results for input(s): NA, K, CL, CO2, GLUCOSE, BUN, CREATININE, CALCIUM in the last 72 hours. PT/INR No results for input(s): LABPROT, INR in the last 72 hours. ABG No results for input(s): PHART, HCO3 in the last 72 hours.  Invalid input(s): PCO2, PO2  Studies/Results: Dg Abd 1 View  Result Date: 12/15/2015 CLINICAL DATA:  Obstructing right staghorn calculus, status post nephrostomy insertion 02/04/2015. Now postop right percutaneous nephrolithotomy.  EXAM: DG C-ARM 61-120 MIN-NO REPORT; ABDOMEN - 1 VIEW COMPARISON:  12/05/2015 FINDINGS: Single spot fluoroscopic intraoperative view demonstrates large bore nephrostomy catheter within the renal pelvis. Safety nephro ureteral catheter extends down the ureter. Moderate hydronephrosis evident. Diffuse collecting system filling defect, suspect related to blood products or clot from the procedure. Difficult to exclude underlying residual stone disease. IMPRESSION: Large bore nephrostomy catheter in good position within the renal pelvis. Safety nephro ureteral catheter extends down the ureter. Residual moderate hydronephrosis with diffuse collecting system filling defect suspicious for blood products/ clot from the nephrolithotomy. Difficult to exclude underlying residual stone burden. Electronically Signed   By: Judie PetitM.  Shick M.D.   On: 12/15/2015 09:30   Dg C-arm 61-120 Min-no Report  Result Date: 12/15/2015 CLINICAL DATA:  Obstructing right staghorn calculus, status post nephrostomy insertion 02/04/2015. Now postop right percutaneous nephrolithotomy. EXAM: DG C-ARM 61-120 MIN-NO REPORT; ABDOMEN - 1 VIEW COMPARISON:  12/05/2015 FINDINGS: Single spot fluoroscopic intraoperative view demonstrates large bore nephrostomy catheter within the renal pelvis. Safety nephro ureteral catheter extends down the ureter. Moderate hydronephrosis evident. Diffuse collecting system filling defect, suspect related to blood products or clot from the procedure. Difficult to exclude underlying residual stone disease. IMPRESSION: Large bore nephrostomy catheter in good position within the renal pelvis. Safety nephro ureteral catheter extends down the ureter. Residual moderate hydronephrosis with diffuse collecting system filling defect suspicious for blood products/ clot from the nephrolithotomy. Difficult to exclude underlying residual stone burden. Electronically Signed   By: Judie PetitM.  Shick M.D.   On: 12/15/2015 09:30     CT report  pending.   She has atelectesis in addition to the tiny RLP stone fragment.       LOS: 0 days    Anner CreteWRENN,Raniah Karan J 12/16/2015 [phone] ID: Tonya Howard Grinder, female   DOB: [birth year], 40 y.o.   MRN: [id number]

## 2015-12-16 NOTE — Discharge Summary (Signed)
Physician Discharge Summary  Patient ID: Tonya Howard MRN: [id number] DOB/AGE: 1975/10/12 40 y.o.  Admit date: 12/15/2015 Discharge date: 12/16/2015  Admission Diagnoses:  Staghorn calculus  Discharge Diagnoses:  Principal Problem:   Staghorn calculus   Past Medical History:  Diagnosis Date  . Anemia   . Calculus (=stone)     Surgeries: Procedure(s): NEPHROLITHOTOMY PERCUTANEOUS FIRST STAGE on 12/15/2015   Consultants (if any):   Discharged Condition: Improved  Hospital Course: Tonya Howard is an 40 y.o. female who was admitted 12/15/2015 with a diagnosis of Staghorn calculus and went to the operating room on 12/15/2015 and underwent the above named procedures.  She was rendered over 99% stone free on post op CT.   She didn't tolerate tube clamping today and will be sent home with the tube on drainage.   I will have her return Friday for tube removal.   She will take the keflex nightly for suppression for the next month.     She was given perioperative antibiotics:  Anti-infectives    Start     Dose/Rate Route Frequency Ordered Stop   12/16/15 0000  cephALEXin (KEFLEX) 500 MG capsule     500 mg Oral Daily at bedtime 12/16/15 1230 12/26/15 2359   12/15/15 1200  cephALEXin (KEFLEX) capsule 500 mg     500 mg Oral Every 6 hours 12/15/15 1017     12/15/15 0547  ceFAZolin (ANCEF) IVPB 2g/100 mL premix     2 g 200 mL/hr over 30 Minutes Intravenous 30 min pre-op 12/15/15 0547 12/15/15 0744    .  She was given sequential compression devices, for DVT prophylaxis.  She benefited maximally from the hospital stay and there were no complications.    Recent vital signs:  Vitals:   12/16/15 0200 12/16/15 0655  BP: 110/64 (!) 113/59  Pulse: 66 72  Resp: 14 14  Temp: 97.8 F (36.6 C) 98 F (36.7 C)    Recent laboratory studies:  Lab Results  Component Value Date   HGB 9.6 (L) 12/16/2015   HGB 10.3 (L) 12/15/2015   HGB 10.3 (L) 12/07/2015   Lab Results   Component Value Date   WBC 8.5 12/07/2015   PLT 381 12/07/2015   Lab Results  Component Value Date   INR 1.02 12/04/2015   Lab Results  Component Value Date   NA 137 12/07/2015   K 4.0 12/07/2015   CL 102 12/07/2015   CO2 27 12/07/2015   BUN 7 12/07/2015   CREATININE 1.01 (H) 12/07/2015   GLUCOSE 92 12/07/2015    Discharge Medications:     Medication List    STOP taking these medications   oxyCODONE 10 mg 12 hr tablet Commonly known as:  OXYCONTIN     TAKE these medications   BC FAST PAIN RELIEF 845-65 MG Pack Generic drug:  Aspirin-Caffeine Take 1 packet by mouth daily as needed (for headache).   cephALEXin 500 MG capsule Commonly known as:  KEFLEX Take 1 capsule (500 mg total) by mouth at bedtime. What changed:  when to take this   folic acid 400 MCG tablet Commonly known as:  FOLVITE Take 400 mcg by mouth daily.   GOODYS EXTRA STRENGTH 520-260-32.5 MG Pack Generic drug:  Aspirin-Acetaminophen-Caffeine Take 1 packet by mouth daily as needed (for headache).   hydroxypropyl methylcellulose / hypromellose 2.5 % ophthalmic solution Commonly known as:  ISOPTO TEARS / GONIOVISC Place 1-2 drops into both eyes 2 (two) times daily as needed for dry  eyes.   medroxyPROGESTERone 10 MG tablet Commonly known as:  PROVERA Take 1 tablet (10 mg total) by mouth daily. Use for ten days   Melatonin 10 MG Tabs Take 10 mg by mouth at bedtime as needed (for sleep.).   multivitamin with minerals Tabs tablet Take 1 tablet by mouth daily.   oxyCODONE-acetaminophen 5-325 MG tablet Commonly known as:  PERCOCET/ROXICET Take 1 tablet by mouth every 6 (six) hours as needed (breakthrough pain).   senna-docusate 8.6-50 MG tablet Commonly known as:  Senokot-S Take 1 tablet by mouth at bedtime.       Diagnostic Studies: Ct Abdomen Wo Contrast  Result Date: 12/16/2015 CLINICAL DATA:  Right-sided percutaneous nephrostomy 12/15/2015 for staghorn calculus. EXAM: CT ABDOMEN  WITHOUT CONTRAST TECHNIQUE: Multidetector CT imaging of the abdomen was performed following the standard protocol without IV contrast. COMPARISON:  12/04/2015 FINDINGS: Lower chest: Bibasilar atelectasis. Normal heart size without pericardial or pleural effusion. Hepatobiliary: Normal noncontrast appearance of the liver. Normal gallbladder, without biliary ductal dilatation. Pancreas: Normal, without mass or ductal dilatation. Spleen: Normal in size, without focal abnormality. Adrenals/Urinary Tract: Normal adrenal glands. Right-sided nephrostomy and safety catheter are identified. Iatrogenic air within the right renal collecting system. There is residual mild to moderate right-sided hydronephrosis. Hyperattenuation in the renal collecting system is likely due to hemorrhage. There are 2 residual lower pole collecting system punctate stones. No abdominal ureteric calculi. No perinephric hematoma. Stomach/Bowel: Normal stomach, without wall thickening. Normal abdominal bowel loops. Vascular/Lymphatic: Normal caliber of the aorta and branch vessels. Decreased size of retroperitoneal nodes, with a index retrocaval node measuring 8 mm today versus 14 mm on the prior. Other: No ascites. Musculoskeletal: No acute osseous abnormality. IMPRESSION: 1. Right-sided nephrostomy and safety catheter in place. Improvement in mild to moderate hydronephrosis with significantly decreased collecting system stone burden and small volume hemorrhage. 2. Improvement in presumably reactive adenopathy. Electronically Signed   By: Jeronimo Greaves M.D.   On: 12/16/2015 08:52   Dg Abd 1 View  Result Date: 12/15/2015 CLINICAL DATA:  Obstructing right staghorn calculus, status post nephrostomy insertion 02/04/2015. Now postop right percutaneous nephrolithotomy. EXAM: DG C-ARM 61-120 MIN-NO REPORT; ABDOMEN - 1 VIEW COMPARISON:  12/05/2015 FINDINGS: Single spot fluoroscopic intraoperative view demonstrates large bore nephrostomy catheter within  the renal pelvis. Safety nephro ureteral catheter extends down the ureter. Moderate hydronephrosis evident. Diffuse collecting system filling defect, suspect related to blood products or clot from the procedure. Difficult to exclude underlying residual stone disease. IMPRESSION: Large bore nephrostomy catheter in good position within the renal pelvis. Safety nephro ureteral catheter extends down the ureter. Residual moderate hydronephrosis with diffuse collecting system filling defect suspicious for blood products/ clot from the nephrolithotomy. Difficult to exclude underlying residual stone burden. Electronically Signed   By: Judie Petit.  Shick M.D.   On: 12/15/2015 09:30   Ct Abdomen Pelvis W Contrast  Result Date: 12/04/2015 CLINICAL DATA:  Right-sided abdominal pain for 6-7 months with worsening over the past 2 months. Nausea and diaphoresis. EXAM: CT ABDOMEN AND PELVIS WITH CONTRAST TECHNIQUE: Multidetector CT imaging of the abdomen and pelvis was performed using the standard protocol following bolus administration of intravenous contrast. CONTRAST:  ISOVUE-300 IOPAMIDOL (ISOVUE-300) INJECTION 61% COMPARISON:  None. FINDINGS: Lower chest: Lung bases show no acute findings. Heart size normal. No pericardial effusion. Hepatobiliary: 11 mm low-attenuation lesion in the central aspect of the liver is too small to characterize. Liver and gallbladder are otherwise unremarkable. No biliary ductal dilatation. Pancreas: Negative. Spleen: Negative. Adrenals/Urinary  Tract: Adrenal glands are unremarkable. Severe right hydronephrosis with a staghorn calculus in the right renal pelvis. Separate stones are seen in the upper and lower poles of the right kidney. There is no excretion of contrast from the right kidney on nephrographic phase imaging. Right ureter is decompressed. Left kidney is unremarkable and left ureter is decompressed. Bladder is unremarkable. Stomach/Bowel: Stomach, small bowel, appendix and colon are  unremarkable. Vascular/Lymphatic: Vascular structures are unremarkable. Lymph nodes in the renal hila measure up to 14 mm. Additional retroperitoneal lymph nodes along the course of the infrarenal aorta are sub cm in short axis size. Porta hepatis and abdominal peritoneal ligament lymph nodes are also sub cm in short axis size. Reproductive: Uterus is visualized.  No adnexal mass. Other: No free fluid. Mesenteries and peritoneum are unremarkable. Small periumbilical hernia contains fat. Musculoskeletal: No worrisome lytic or sclerotic lesions. IMPRESSION: 1. 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 2. Enlarged lymph nodes in the right renal hilum, likely reactive. 3. Additional right renal stones. Electronically Signed   By: Leanna Battles M.D.   On: 12/04/2015 17:21   Dg C-arm 61-120 Min-no Report  Result Date: 12/15/2015 CLINICAL DATA:  Obstructing right staghorn calculus, status post nephrostomy insertion 02/04/2015. Now postop right percutaneous nephrolithotomy. EXAM: DG C-ARM 61-120 MIN-NO REPORT; ABDOMEN - 1 VIEW COMPARISON:  12/05/2015 FINDINGS: Single spot fluoroscopic intraoperative view demonstrates large bore nephrostomy catheter within the renal pelvis. Safety nephro ureteral catheter extends down the ureter. Moderate hydronephrosis evident. Diffuse collecting system filling defect, suspect related to blood products or clot from the procedure. Difficult to exclude underlying residual stone disease. IMPRESSION: Large bore nephrostomy catheter in good position within the renal pelvis. Safety nephro ureteral catheter extends down the ureter. Residual moderate hydronephrosis with diffuse collecting system filling defect suspicious for blood products/ clot from the nephrolithotomy. Difficult to exclude underlying residual stone burden. Electronically Signed   By: Judie Petit.  Shick M.D.   On: 12/15/2015 09:30    Ir Nephrostomy Placement Right  Result Date: 12/05/2015 INDICATION: Obstructing infected right kidney secondary to a complex staghorn calculus. Urosepsis. EXAM: IR NEPHROSTOMY PLACEMENT RIGHT COMPARISON:  12/04/2015 CT MEDICATIONS: 400 mg Cipro; The antibiotic was administered in an appropriate time frame prior to skin puncture. ANESTHESIA/SEDATION: Fentanyl 100 mcg IV; Versed 2.0 mg IV Moderate Sedation Time:  10 minutes The patient was continuously monitored during the procedure by the interventional radiology nurse under my direct supervision. CONTRAST:  20 cc Isovue - administered into the collecting system(s) FLUOROSCOPY TIME:  Fluoroscopy Time:  36 seconds (1 mGy). COMPLICATIONS: None immediate. PROCEDURE: Informed written consent was obtained from the patient after a thorough discussion of the procedural risks, benefits and alternatives. All questions were addressed. Maximal Sterile Barrier Technique was utilized including caps, mask, sterile gowns, sterile gloves, sterile drape, hand hygiene and skin antiseptic. A timeout was performed prior to the initiation of the procedure. Previous imaging reviewed. Patient positioned prone. Preliminary ultrasound performed. The hydronephrotic right kidney containing a staghorn calculus was localized. Under sterile conditions and local anesthesia, ultrasound percutaneous needle access performed of a dilated mid pole calyx. Needle position confirmed with ultrasound. Images obtained for documentation. Guidewire advanced easily into the upper pole dilated calyx. Accustick dilator set advanced. Amplatz guidewire exchange. Tract dilatation performed to insert a 10 French catheter. Retention loop formed in the dilated collecting system. Contrast injection confirms position. Images obtained for documentation. Catheter secured with  a Prolene suture and connected to external gravity drainage. Sterile dressing applied. No immediate complication. Patient tolerated the  procedure well. IMPRESSION: Successful ultrasound and fluoroscopic 10 French right nephrostomy insertion. Urine culture sent. Electronically Signed   By: Judie PetitM.  Shick M.D.   On: 12/05/2015 10:49    Disposition: 01-Home or Self Care  Discharge Instructions    Discontinue IV    Complete by:  As directed       Follow-up Information    [facility] Follow up on 12/18/2015.   Specialty:  Urology Why:  830 2nd appt is 01/15/16 at 1015.  Contact information: [address] [place] [address] [phone]            Signed: Anner CreteWRENN,Charmin Aguiniga J 12/16/2015, 12:34 PM

## 2015-12-17 LAB — TYPE AND SCREEN
ABO/RH(D): B POS
Antibody Screen: NEGATIVE
UNIT DIVISION: 0

## 2015-12-29 LAB — STONE ANALYSIS
AMMONIUM ACID URATE: 7 %
Ca Oxalate,Monohydr.: 3 %
Ca phos cry stone ql IR: 20 %
Magnesium Ammon Phos: 70 %
STONE WEIGHT KSTONE: 1646 mg

## 2017-12-01 IMAGING — CT CT ABDOMEN W/O CM
2 of 4 series · 16 of 46 positions shown, 18 images · non-contrast
Comparison: 12/04/2015

CLINICAL DATA: Right-sided percutaneous nephrostomy 12/15/2015 for
staghorn calculus.

EXAM:
CT ABDOMEN WITHOUT CONTRAST
TECHNIQUE: Multidetector CT imaging of the abdomen was performed following the
standard protocol without IV contrast.

[Series 2: axial st · axial · 0.59mm/px · z∈[+1210,+1456]mm · 13 of 55 slices shown, 15 images]
[im 3/55  soft-tissue]
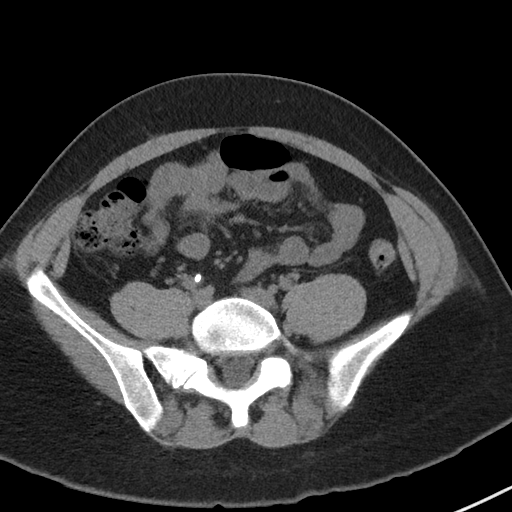
[im 3/55  bone]
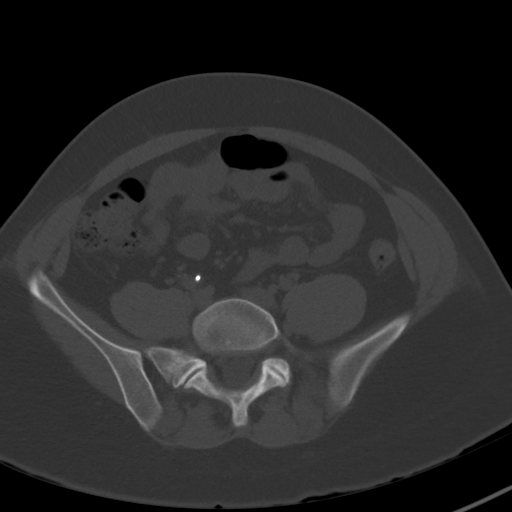
[im 7/55  soft-tissue]
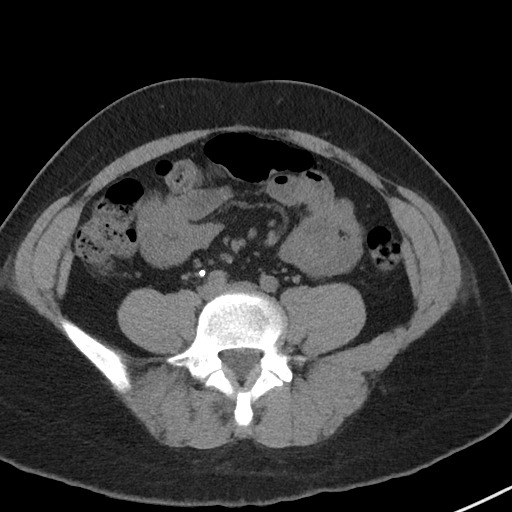
[im 11/55  soft-tissue]
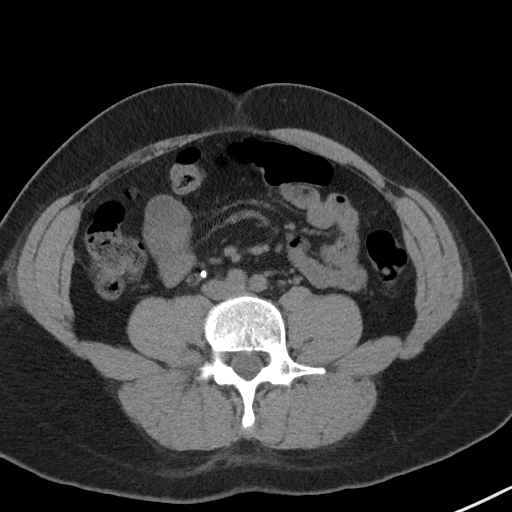
[im 16/55  soft-tissue]
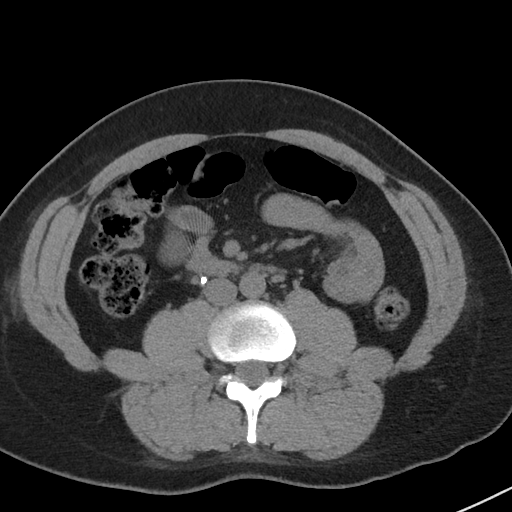
[im 20/55  soft-tissue]
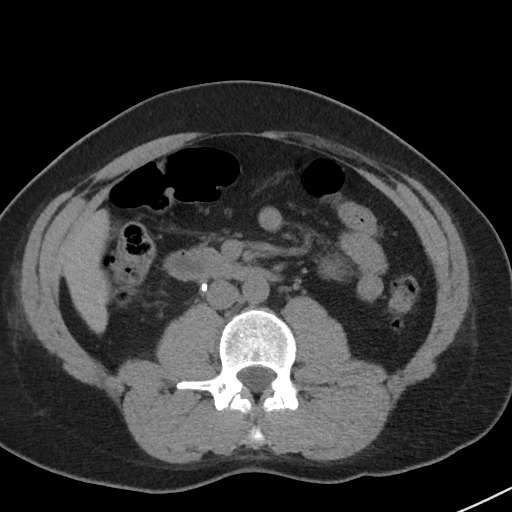
[im 24/55  soft-tissue]
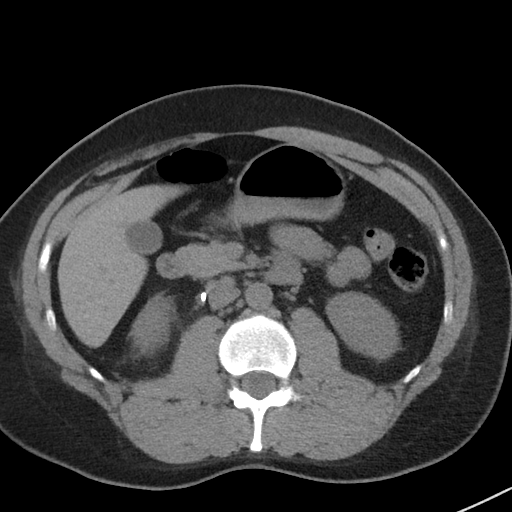
[im 29/55  soft-tissue]
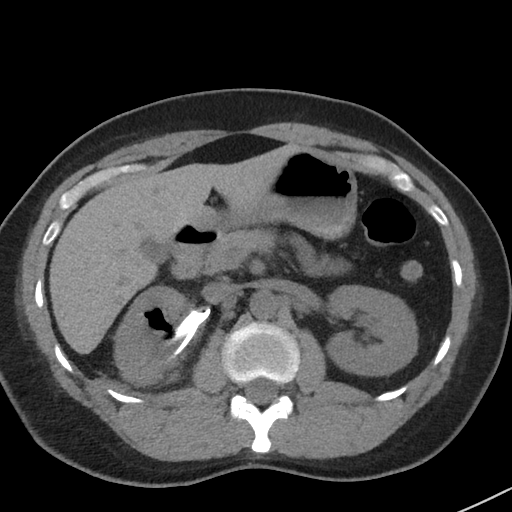
[im 31/55  soft-tissue]
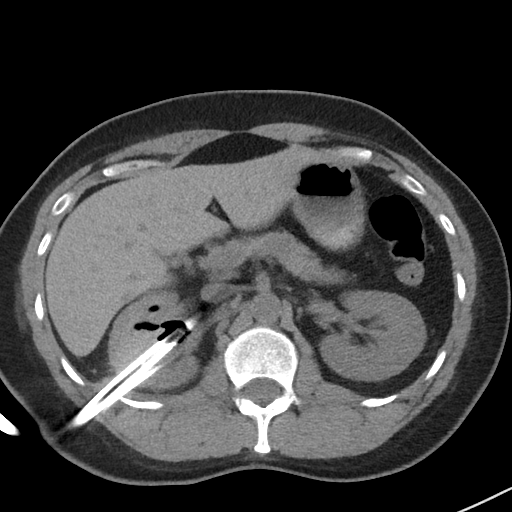
[im 35/55  soft-tissue]
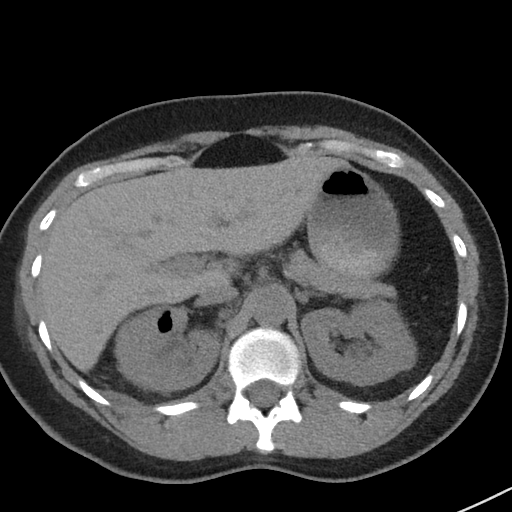
[im 35/55  bone]
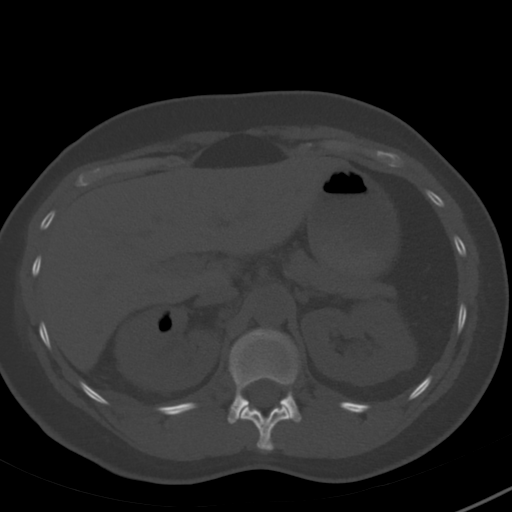
[im 39/55  soft-tissue]
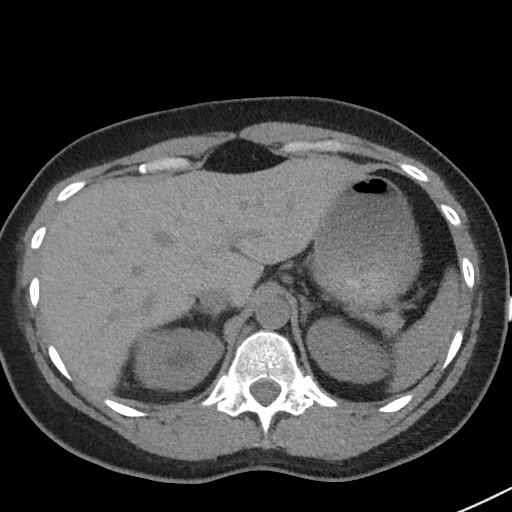
[im 44/55  soft-tissue]
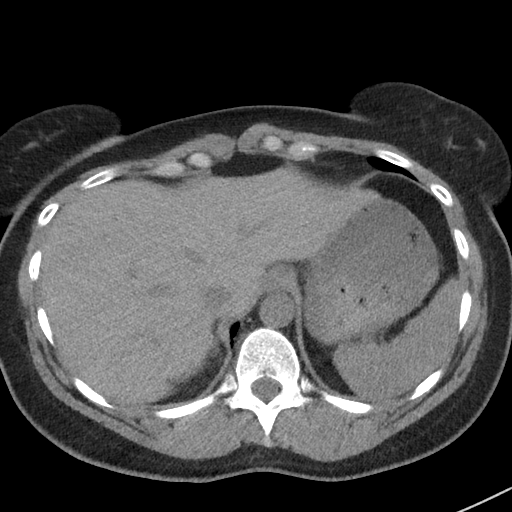
[im 48/55  soft-tissue]
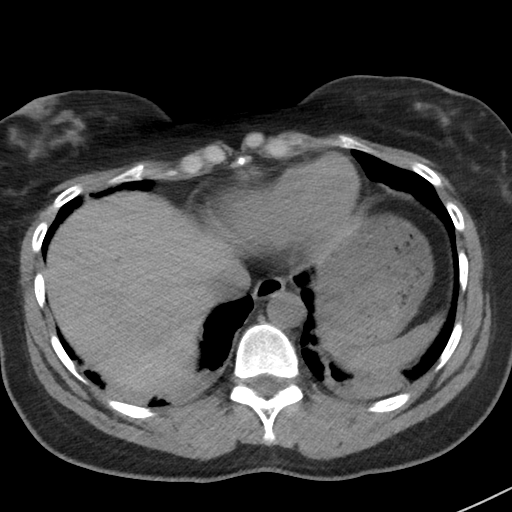
[im 52/55  soft-tissue]
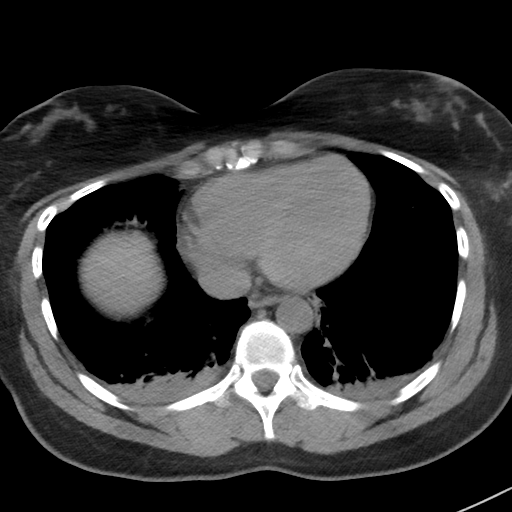

[Series 5: coronal · coronal · 0.53mm/px · 3 of 106 slices shown]
[im 36/106  soft-tissue]
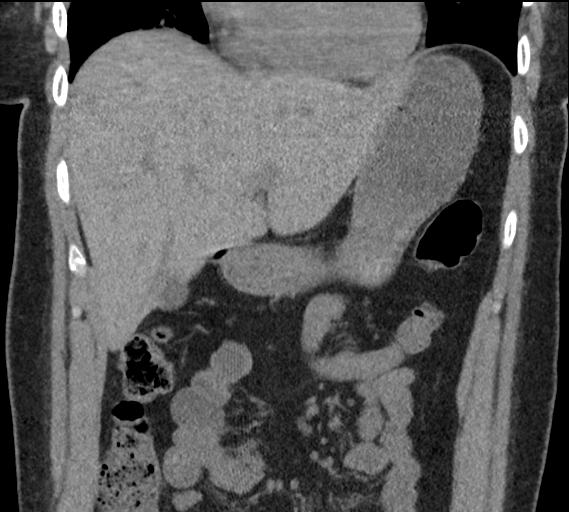
[im 47/106  soft-tissue]
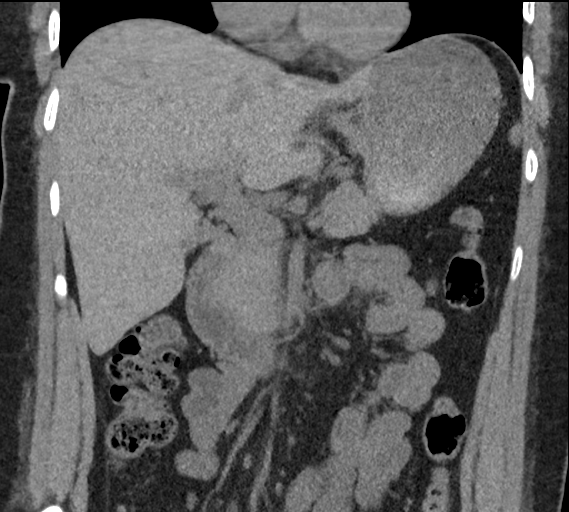
[im 59/106  soft-tissue]
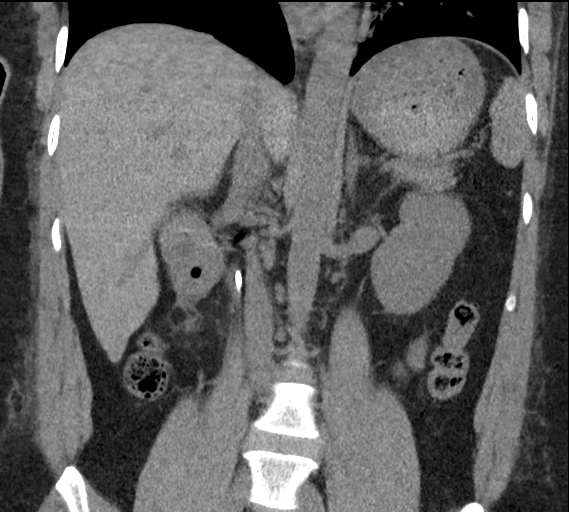

[16 of 46 positions shown; findings below may reference images not displayed]

FINDINGS: Lower chest: Bibasilar atelectasis. Normal heart size without
pericardial or pleural effusion.

Hepatobiliary: Normal noncontrast appearance of the liver. Normal
gallbladder, without biliary ductal dilatation.

Pancreas: Normal, without mass or ductal dilatation.

Spleen: Normal in size, without focal abnormality.

Adrenals/Urinary Tract: Normal adrenal glands. Right-sided
nephrostomy and safety catheter are identified. Iatrogenic air
within the right renal collecting system. There is residual mild to
moderate right-sided hydronephrosis. Hyperattenuation in the renal
collecting system is likely due to hemorrhage. There are 2 residual
lower pole collecting system punctate stones. No abdominal ureteric
calculi. No perinephric hematoma.

Stomach/Bowel: Normal stomach, without wall thickening. Normal
abdominal bowel loops.

Vascular/Lymphatic: Normal caliber of the aorta and branch vessels.
Decreased size of retroperitoneal nodes, with a index retrocaval
node measuring 8 mm today versus 14 mm on the prior.

Other: No ascites.

Musculoskeletal: No acute osseous abnormality.
IMPRESSION: 1. Right-sided nephrostomy and safety catheter in place. Improvement
in mild to moderate hydronephrosis with significantly decreased
collecting system stone burden and small volume hemorrhage.
2. Improvement in presumably reactive adenopathy.

## 2020-06-18 ENCOUNTER — Ambulatory Visit (HOSPITAL_COMMUNITY)
Admission: EM | Admit: 2020-06-18 | Discharge: 2020-06-18 | Disposition: A | Discharge status: Home / Self Care | Payer: 59 | Attending: Internal Medicine | Admitting: Internal Medicine

## 2020-06-18 ENCOUNTER — Encounter (HOSPITAL_COMMUNITY): Payer: Self-pay

## 2020-06-18 ENCOUNTER — Emergency Department (HOSPITAL_COMMUNITY)
Admission: EM | Admit: 2020-06-18 | Discharge: 2020-06-18 | Disposition: A | Discharge status: Home / Self Care | Payer: Self-pay | Attending: Emergency Medicine | Admitting: Emergency Medicine

## 2020-06-18 ENCOUNTER — Emergency Department (HOSPITAL_COMMUNITY): Payer: Self-pay

## 2020-06-18 ENCOUNTER — Other Ambulatory Visit: Payer: Self-pay

## 2020-06-18 ENCOUNTER — Encounter (HOSPITAL_COMMUNITY): Payer: Self-pay | Admitting: Pharmacy Technician

## 2020-06-18 DIAGNOSIS — R197 Diarrhea, unspecified: Secondary | ICD-10-CM

## 2020-06-18 DIAGNOSIS — A09 Infectious gastroenteritis and colitis, unspecified: Secondary | ICD-10-CM | POA: Insufficient documentation

## 2020-06-18 DIAGNOSIS — R109 Unspecified abdominal pain: Secondary | ICD-10-CM

## 2020-06-18 DIAGNOSIS — R112 Nausea with vomiting, unspecified: Secondary | ICD-10-CM

## 2020-06-18 DIAGNOSIS — R1032 Left lower quadrant pain: Secondary | ICD-10-CM

## 2020-06-18 DIAGNOSIS — Z87442 Personal history of urinary calculi: Secondary | ICD-10-CM | POA: Insufficient documentation

## 2020-06-18 DIAGNOSIS — R509 Fever, unspecified: Secondary | ICD-10-CM

## 2020-06-18 LAB — COMPREHENSIVE METABOLIC PANEL
ALT: 26 U/L (ref 0–44)
AST: 25 U/L (ref 15–41)
Albumin: 3.9 g/dL (ref 3.5–5.0)
Alkaline Phosphatase: 73 U/L (ref 38–126)
Anion gap: 8 (ref 5–15)
BUN: 7 mg/dL (ref 6–20)
CO2: 24 mmol/L (ref 22–32)
Calcium: 9.5 mg/dL (ref 8.9–10.3)
Chloride: 105 mmol/L (ref 98–111)
Creatinine, Ser: 0.84 mg/dL (ref 0.44–1.00)
GFR, Estimated: >60 mL/min (ref >60)
Glucose, Bld: 89 mg/dL (ref 70–99)
Potassium: 4.1 mmol/L (ref 3.5–5.1)
Sodium: 137 mmol/L (ref 135–145)
Total Bilirubin: 0.4 mg/dL (ref 0.3–1.2)
Total Protein: 7.8 g/dL (ref 6.5–8.1)

## 2020-06-18 LAB — POCT URINALYSIS DIPSTICK, ED / UC
Bilirubin Urine: NEGATIVE
Glucose, UA: NEGATIVE mg/dL
Ketones, ur: NEGATIVE mg/dL
Leukocytes,Ua: NEGATIVE
Nitrite: NEGATIVE
Protein, ur: NEGATIVE mg/dL
Specific Gravity, Urine: 1.015 (ref 1.005–1.030)
Urobilinogen, UA: 0.2 mg/dL (ref 0.0–1.0)
pH: 6 (ref 5.0–8.0)

## 2020-06-18 LAB — CBC
HCT: 42.8 % (ref 36.0–46.0)
Hemoglobin: 13.8 g/dL (ref 12.0–15.0)
MCH: 29.4 pg (ref 26.0–34.0)
MCHC: 32.2 g/dL (ref 30.0–36.0)
MCV: 91.1 fL (ref 80.0–100.0)
Platelets: 301 10*3/uL (ref 150–400)
RBC: 4.7 MIL/uL (ref 3.87–5.11)
RDW: 13.2 % (ref 11.5–15.5)
WBC: 7.5 10*3/uL (ref 4.0–10.5)
nRBC: 0 % (ref 0.0–0.2)

## 2020-06-18 LAB — URINALYSIS, ROUTINE W REFLEX MICROSCOPIC
Bilirubin Urine: NEGATIVE
Glucose, UA: NEGATIVE mg/dL
Hgb urine dipstick: NEGATIVE
Ketones, ur: NEGATIVE mg/dL
Leukocytes,Ua: NEGATIVE
Nitrite: NEGATIVE
Protein, ur: NEGATIVE mg/dL
Specific Gravity, Urine: 1.021 (ref 1.005–1.030)
pH: 5 (ref 5.0–8.0)

## 2020-06-18 LAB — LIPASE, BLOOD: Lipase: 26 U/L (ref 11–51)

## 2020-06-18 LAB — I-STAT BETA HCG BLOOD, ED (MC, WL, AP ONLY): I-stat hCG, quantitative: <5 m[IU]/mL (ref <5)

## 2020-06-18 MED ORDER — AZITHROMYCIN 500 MG PO TABS: 500.0000 mg | ORAL_TABLET | Freq: Every day | ORAL | 0 refills | Status: AC

## 2020-06-18 NOTE — ED Provider Notes (Signed)
[facility] EMERGENCY DEPARTMENT Provider Note   CSN: [id number] Arrival date & time: 06/18/20  0941     History Chief Complaint  Patient presents with  . Abdominal Pain    Tonya Howard is a 45 y.o. female with from past medical history of kidney stones who presents to the emergency department today for left lower quadrant pain for the past 2 weeks.  Patient states that the pain is intermittent on the left side, actually has been getting better over the past couple of days.  Patient states that she traveled to [place] a week ago, was having the pain before she went to [place].  Patient states that the pain was severe on Friday, about 6 days ago and she went to the ER and got.  They diagnosed her with a obstructing kidney stone at that time, patient states that she did not get a CT however did get an ultrasound.  Was placed on amoxicillin, tramadol and patient states that since then pain has still been intermittent.  States that on Tuesday, 2 days ago she started vomiting and having diarrhea.  States that time she also had a temperature, T-max 102.4.  Patient states that the diarrhea has been resolving to the point where she did not have any today, has not had any vomiting today either.  Patient states that she feels a lot better than she has been, however went to urgent care and they told her to come here for CT scan.  Patient denies any sick contacts, no bloody stools or bloody emesis.  States that she still taking her amoxicillin and her tramadol.  Denies any dysuria or hematuria.  Denies any back pain.  Denies any chest pain or shortness of breath.  States that temperature has resolved.  Patient does admit to decreased appetite over the past week.  HPI     Past Medical History:  Diagnosis Date  . Anemia   . Calculus (=stone)     Patient Active Problem List   Diagnosis Date Noted  . Kidney stone   . Calculous pyelonephritis   . 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  Urinary tract infection without hematuria 12/04/2015    Past Surgical History:  Procedure Laterality Date  . BREAST SURGERY Right 1997   benign  . IR GENERIC HISTORICAL  12/05/2015   IR NEPHROSTOMY PLACEMENT RIGHT 12/05/2015 [facility]  . NEPHROLITHOTOMY Right 12/15/2015   Procedure: NEPHROLITHOTOMY PERCUTANEOUS FIRST STAGE;  Surgeon: Bjorn Pippin, MD;  Location: [facility];  Service: Urology;  Laterality: Right;     OB History    Gravida  1   Para      Term      Preterm      AB  1   Living        SAB  1   IAB      Ectopic      Multiple      Live Births              Family History  Problem Relation Age of Onset  . Diabetes Father   . Cancer Maternal Grandmother   . Diabetes Paternal Grandfather     Social History   Tobacco Use  . Smoking status: Never Smoker  . Smokeless tobacco: Never Used  Substance Use Topics  . Alcohol use: No  . Drug use: No    Home Medications Prior to Admission medications   Medication Sig Start Date End Date Taking? Authorizing Provider  azithromycin (ZITHROMAX) 500 MG tablet Take 1 tablet (500 mg total) by mouth daily for 3 doses. Take first 2 tablets together, then 1 every day until finished. 06/18/20 06/21/20 Yes Xavia Kniskern, PA-C  Aspirin-Acetaminophen-Caffeine (GOODYS EXTRA STRENGTH) 520-260-32.5 MG PACK Take 1 packet by mouth daily as needed (for headache).    [provider]  Aspirin-Caffeine (BC FAST PAIN RELIEF) 845-65 MG PACK Take 1 packet by mouth daily as needed (for headache).    [provider]  folic acid (FOLVITE) 400 MCG tablet Take 400 mcg by mouth daily.    [provider]  hydroxypropyl methylcellulose / hypromellose (ISOPTO TEARS / GONIOVISC) 2.5 % ophthalmic solution Place 1-2 drops into both eyes 2 (two) times daily as needed for dry eyes.    [provider]  medroxyPROGESTERone (PROVERA) 10 MG tablet Take 1 tablet (10 mg total) by mouth daily. Use for ten days Patient  not taking: Reported on 12/04/2015 10/22/15   Tereso Newcomer, MD  Melatonin 10 MG TABS Take 10 mg by mouth at bedtime as needed (for sleep.).     [provider]  Multiple Vitamin (MULTIVITAMIN WITH MINERALS) TABS tablet Take 1 tablet by mouth daily.    [provider]  oxyCODONE-acetaminophen (PERCOCET/ROXICET) 5-325 MG tablet Take 1 tablet by mouth every 6 (six) hours as needed (breakthrough pain). 12/16/15   Bjorn Pippin, MD  senna-docusate (SENOKOT-S) 8.6-50 MG tablet Take 1 tablet by mouth at bedtime. 12/07/15   Durward Parcel, DO    Allergies    Shellfish allergy  Review of Systems   Review of Systems  Constitutional: Positive for fever. Negative for chills, diaphoresis and fatigue.  HENT: Negative for congestion, sore throat and trouble swallowing.   Eyes: Negative for pain and visual disturbance.  Respiratory: Negative for cough, shortness of breath and wheezing.   Cardiovascular: Negative for chest pain, palpitations and leg swelling.  Gastrointestinal: Positive for abdominal pain, diarrhea, nausea and vomiting. Negative for abdominal distention.  Genitourinary: Negative for difficulty urinating.  Musculoskeletal: Negative for back pain, neck pain and neck stiffness.  Skin: Negative for pallor.  Neurological: Negative for dizziness, speech difficulty, weakness and headaches.  Psychiatric/Behavioral: Negative for confusion.    Physical Exam Updated Vital Signs BP 114/83   Pulse 73   Temp 98.3 F (36.8 C)   Resp 15   LMP  (LMP Unknown)   SpO2 100%   Physical Exam Constitutional:      General: She is not in acute distress.    Appearance: Normal appearance. She is not ill-appearing, toxic-appearing or diaphoretic.  HENT:     Mouth/Throat:     Mouth: Mucous membranes are moist.     Pharynx: Oropharynx is clear.  Eyes:     General: No scleral icterus.    Extraocular Movements: Extraocular movements intact.     Pupils: Pupils are equal, round, and  reactive to light.  Cardiovascular:     Rate and Rhythm: Normal rate and regular rhythm.     Pulses: Normal pulses.     Heart sounds: Normal heart sounds.  Pulmonary:     Effort: Pulmonary effort is normal. No respiratory distress.     Breath sounds: Normal breath sounds. No stridor. No wheezing, rhonchi or rales.  Chest:     Chest wall: No tenderness.  Abdominal:     General: Abdomen is flat. There is no distension.     Palpations: Abdomen is soft.     Tenderness: There is no abdominal tenderness. There  is no right CVA tenderness, left CVA tenderness, guarding or rebound.     Comments: No tenderness to palpation of left lower quadrant, patient states that its not currently hurting her.  No CVA tenderness.  Musculoskeletal:        General: No swelling or tenderness. Normal range of motion.     Cervical back: Normal range of motion and neck supple. No rigidity.     Right lower leg: No edema.     Left lower leg: No edema.  Skin:    General: Skin is warm and dry.     Capillary Refill: Capillary refill takes less than 2 seconds.     Coloration: Skin is not pale.  Neurological:     General: No focal deficit present.     Mental Status: She is alert and oriented to person, place, and time.  Psychiatric:        Mood and Affect: Mood normal.        Behavior: Behavior normal.     ED Results / Procedures / Treatments   Labs (all labs ordered are listed, but only abnormal results are displayed) Labs Reviewed  URINALYSIS, ROUTINE W REFLEX MICROSCOPIC - Abnormal; Notable for the following components:      Result Value   APPearance HAZY (*)    All other components within normal limits  LIPASE, BLOOD  COMPREHENSIVE METABOLIC PANEL  CBC  I-STAT BETA HCG BLOOD, ED ([facility], [facility], [facility] ONLY)    EKG None  Radiology CT Abdomen Pelvis Wo Contrast  Result Date: 06/18/2020 CLINICAL DATA:  Left lower quadrant abdominal pain, nausea, vomiting, back pain, and fever. EXAM: CT ABDOMEN AND PELVIS  WITHOUT CONTRAST TECHNIQUE: Multidetector CT imaging of the abdomen and pelvis was performed following the standard protocol without IV contrast. COMPARISON:  CT abdomen 12/16/2015. CT abdomen and pelvis 12/04/2015. FINDINGS: Lower chest: A 3 mm subpleural nodule laterally in the left lower lobe was not present on the prior study (series 5, image 14). No basilar lung consolidation or pleural effusion is present. Hepatobiliary: No focal liver abnormality is seen. No gallstones, gallbladder wall thickening, or biliary dilatation. Pancreas: Unremarkable. Spleen: Unremarkable. Adrenals/Urinary Tract: Unremarkable adrenal glands. New asymmetric moderate right renal atrophy with mild right hydronephrosis. Nonobstructing 2 mm calculus in the lower pole of the right kidney and 2 punctate calculi in the upper pole of the left kidney. No left-sided hydronephrosis. No ureteral dilatation or ureteral calculi. Unremarkable bladder. Stomach/Bowel: The stomach is unremarkable. There is no evidence of bowel obstruction. Mild left-sided colonic diverticulosis is noted without evidence of diverticulitis. The appendix is unremarkable. Vascular/Lymphatic: Normal caliber of the abdominal aorta. Numerous mildly enlarged mesenteric lymph nodes measure up to 8 mm in short axis, larger than on the prior CT. Reproductive: Uterus and bilateral adnexa are unremarkable. Other: No intraperitoneal free fluid. Musculoskeletal: Unremarkable bone marrow cysts crash that no acute osseous abnormality or suspicious osseous lesion. IMPRESSION: 1. Right renal atrophy with mild hydronephrosis. 2. Small nonobstructing renal calculi bilaterally. 3. Numerous mildly enlarged mesenteric lymph nodes, nonspecific. These may be reactive/inflammatory although low-grade lymphoproliferative disorder is not excluded. 4. New 3 mm left lower lobe lung nodule. No follow-up needed if patient is low-risk. Non-contrast chest CT can be considered in 12 months if patient is  high-risk. This recommendation follows the consensus statement: Guidelines for Management of Incidental Pulmonary Nodules Detected on CT Images: From the Fleischner Society 2017; Radiology 2017; 284:228-243. Electronically Signed   By: Sebastian Ache M.D.   On: 06/18/2020  14:06    Procedures Procedures   Medications Ordered in ED Medications - No data to display  ED Course  I have reviewed the triage vital signs and the nursing notes.  Pertinent labs & imaging results that were available during my care of the patient were reviewed by me and considered in my medical decision making (see chart for details).    MDM Rules/Calculators/A&P                         Tonya Howard is a 45 y.o. female with from past medical history of kidney stones who presents to the emerge department today for left lower quadrant pain for the past 2 weeks.  Pain started before patient went to [place], not currently having pain right now.  Benign physical exam, no abdominal tenderness on exam.  Patient is nontoxic-appearing.  Diarrhea nausea and vomiting started on Tuesday, have been significantly resolving, patient is primarily here for CT scan. Due to IV contrast shortage, will obtain CT abdomen pelvis without which would help rule out nephrolithiasis and other acute intra-abdominal pathology.  Suspicion for acute colitis, low concern for C. difficile since patient has white count looks normal.  CBC and CMP unremarkable, urinalysis unremarkable.  CT does show some incidental findings, does not show any acute pathology on left lower side which could be explaining patient's symptoms.  Did consider that patient could have passed stone on left side, patient is not tender at all on left side anymore.  Incidental findings discussed with patient, most likely lymph nodes are from gastritis.  In regards to your right kidney atrophy, creatinine is normal, patient will follow up with PCP for this.  Shared decision making about  azithromycin at this time since patient was febrile with traveler's diarrhea, we decided that we would proceed with this.  Patient will follow up with PCP.  Patient is comfortable with plan, to be discharged at this time.  Doubt need for further emergent work up at this time. I explained the diagnosis and have given explicit precautions to return to the ER including for any other new or worsening symptoms. The patient understands and accepts the medical plan as it's been dictated and I have answered their questions. Discharge instructions concerning home care and prescriptions have been given. The patient is STABLE and is discharged to home in good condition.   Final Clinical Impression(s) / ED Diagnoses Final diagnoses:  LLQ abdominal pain  Diarrhea of infectious origin    Rx / DC Orders ED Discharge Orders         Ordered    azithromycin (ZITHROMAX) 500 MG tablet  Daily        06/18/20 1440           Farrel Gordonatel, Malyia Moro, PA-C 06/18/20 1515    Melene PlanFloyd, Dan, DO 06/18/20 1622

## 2020-06-18 NOTE — ED Triage Notes (Signed)
Pt here via pov from UC with reports of LLQ abdominal pain, nausea, vomiting, back pain and fevers. Reports kidney obstruction and recently took amoxicillin for same.

## 2020-06-18 NOTE — ED Provider Notes (Signed)
[facility]    CSN: [id number] Arrival date & time: 06/18/20  0820      History   Chief Complaint Chief Complaint  Patient presents with  . Abdominal Pain  . Emesis  . Back Pain  . Fever    HPI Tonya Howard is a 45 y.o. female who presents with LLQ pain x 2 weeks. Started having vomiting and diarrhea x 2 days. Denies blood in the stool. Was out of the country last week and was told while in [place] she had an obstruction in her L kidney when they did an ultrasound. She was placed Amoxicillin, pain meds, and natural things to pass the stone. She gets intermittent unpredictable pains  And gets fevers up to 102. The intense pains last 5 minutes to 10 minutes and then resolves.  The diarrhea is getting better, had pasty # 4 stools yesterday. Tuesday had +10 BM's. She had cramping which got better after she passed the stool. Her appetite has been down all week.  Has been drinking lemon juice and pineapple juice to help it pass. Has hx of renal stones and has passed them before.    Past Medical History:  Diagnosis Date  . Anemia   . Calculus (=stone)     Patient Active Problem List   Diagnosis Date Noted  . Kidney stone   . Calculous pyelonephritis   . 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 Urinary tract infection without hematuria 12/04/2015    Past Surgical History:  Procedure Laterality Date  . BREAST SURGERY Right 1997   benign  . IR GENERIC HISTORICAL  12/05/2015   IR NEPHROSTOMY PLACEMENT RIGHT 12/05/2015 [facility]  . NEPHROLITHOTOMY Right 12/15/2015   Procedure: NEPHROLITHOTOMY PERCUTANEOUS FIRST STAGE;  Surgeon: Bjorn Pippin, MD;  Location: [facility];  Service: Urology;  Laterality: Right;    OB History    Gravida  1   Para      Term      Preterm      AB  1   Living        SAB  1   IAB      Ectopic      Multiple      Live Births               Home Medications    Prior to Admission medications   Medication Sig Start Date End Date  Taking? Authorizing Provider  Aspirin-Acetaminophen-Caffeine (GOODYS EXTRA STRENGTH) 520-260-32.5 MG PACK Take 1 packet by mouth daily as needed (for headache).    [provider]  Aspirin-Caffeine (BC FAST PAIN RELIEF) 845-65 MG PACK Take 1 packet by mouth daily as needed (for headache).    [provider]  folic acid (FOLVITE) 400 MCG tablet Take 400 mcg by mouth daily.    [provider]  hydroxypropyl methylcellulose / hypromellose (ISOPTO TEARS / GONIOVISC) 2.5 % ophthalmic solution Place 1-2 drops into both eyes 2 (two) times daily as needed for dry eyes.    [provider]  medroxyPROGESTERone (PROVERA) 10 MG tablet Take 1 tablet (10 mg total) by mouth daily. Use for ten days Patient not taking: Reported on 12/04/2015 10/22/15   Tereso Newcomer, MD  Melatonin 10 MG TABS Take 10 mg by mouth at bedtime as needed (for sleep.).     [provider]  Multiple Vitamin (MULTIVITAMIN WITH MINERALS) TABS tablet Take 1 tablet by mouth daily.    [provider]  oxyCODONE-acetaminophen (PERCOCET/ROXICET) 5-325 MG tablet  Take 1 tablet by mouth every 6 (six) hours as needed (breakthrough pain). 12/16/15   Bjorn Pippin, MD  senna-docusate (SENOKOT-S) 8.6-50 MG tablet Take 1 tablet by mouth at bedtime. 12/07/15   Durward Parcel, DO    Family History Family History  Problem Relation Age of Onset  . Diabetes Father   . Cancer Maternal Grandmother   . Diabetes Paternal Grandfather     Social History Social History   Tobacco Use  . Smoking status: Never Smoker  . Smokeless tobacco: Never Used  Substance Use Topics  . Alcohol use: No  . Drug use: No     Allergies   Shellfish allergy   Review of Systems Review of Systems  Constitutional: Positive for appetite change and fever.  HENT: Negative for congestion.   Respiratory: Negative for cough.   Gastrointestinal: Positive for abdominal distention, abdominal pain, diarrhea and vomiting.   Genitourinary: Negative for dysuria, flank pain, frequency and hematuria.  Musculoskeletal: Negative for back pain and myalgias.  Skin: Negative for rash.  Neurological: Negative for weakness.     Physical Exam Triage Vital Signs ED Triage Vitals  Enc Vitals Group     BP 06/18/20 0847 125/87     Pulse Rate 06/18/20 0847 88     Resp 06/18/20 0847 19     Temp 06/18/20 0847 98.6 F (37 C)     Temp Source 06/18/20 0847 Oral     SpO2 06/18/20 0847 98 %     Weight --      Height --      Head Circumference --      Peak Flow --      Pain Score 06/18/20 0846 6     Pain Loc --      Pain Edu? --      Excl. in GC? --    No data found.  Updated Vital Signs BP 125/87 (BP Location: Left Arm)   Pulse 88   Temp 98.6 F (37 C) (Oral)   Resp 19   LMP  (LMP Unknown)   SpO2 98%   Visual Acuity Right Eye Distance:   Left Eye Distance:   Bilateral Distance:    Right Eye Near:   Left Eye Near:    Bilateral Near:     Physical Exam Vitals and nursing note reviewed.  Constitutional:      General: She is not in acute distress.    Appearance: She is normal weight. She is not toxic-appearing.  HENT:     Head: Normocephalic.  Eyes:     Extraocular Movements: Extraocular movements intact.  Pulmonary:     Effort: Pulmonary effort is normal.  Abdominal:     General: Bowel sounds are decreased. There is distension.     Palpations: Abdomen is soft.     Tenderness: There is abdominal tenderness in the left lower quadrant. There is guarding. Positive signs include psoas sign.  Skin:    General: Skin is warm and dry.     Findings: No rash.  Neurological:     Mental Status: She is alert and oriented to person, place, and time.  Psychiatric:        Mood and Affect: Mood normal.        Behavior: Behavior normal.      UC Treatments / Results  Labs (all labs ordered are listed, but only abnormal results are displayed) Labs Reviewed  POCT URINALYSIS DIPSTICK, ED / UC - Abnormal;  Notable for the following components:  Result Value   Hgb urine dipstick TRACE (*)    All other components within normal limits    EKG   Radiology No results found.  Procedures Procedures (including critical care time)  Medications Ordered in UC Medications - No data to display  Initial Impression / Assessment and Plan / UC Course  I have reviewed the triage vital signs and the nursing notes. Pertinent labs results that were available during my care of the patient were reviewed by me and considered in my medical decision making (see chart for details). Acute LLQ pain. Sent to ER for further work up.  Final Clinical Impressions(s) / UC Diagnoses   Final diagnoses:  Abdominal pain, acute     Discharge Instructions     You need to go to ER to have more tests done like CT scan and lab work     ED Prescriptions    None     PDMP not reviewed this encounter.   Garey Ham, [place] 06/18/20 [phone]

## 2020-06-18 NOTE — ED Triage Notes (Signed)
Pt c/o LLQ sharp pain, vomiting, back pain and fever x 2 weeks. Pt states the sxs have gotten worse and states she has had diarrhea. Pt states she was told her kidney was obstructed.

## 2020-06-18 NOTE — Discharge Instructions (Signed)
You need to go to ER to have more tests done like CT scan and lab work

## 2020-06-18 NOTE — Discharge Instructions (Signed)
  You were evaluated in the Emergency Department and after careful evaluation, we did not find any emergent condition requiring admission or further testing in the hospital.   Your exam/testing today was overall reassuring.  There is nothing suggesting of your CT scan in regards to your left lower quadrant pain, however I did attach your CT scan below as we discussed I want you to talk about these incidental findings with your primary care doctor.  If you do not have 1 you can go to [facility] health community health or wellness or use the attached guides.  I also prescribed she will azithromycin for your diarrhea, do know that side effects of this are additional diarrhea.  Make sure to stay hydrated and drink plenty of fluids, you can use the bland diet attached.  If you have any new worsening concerning symptom please come back to the ER.  Please help with your primary care in the next couple of days. Please return to the Emergency Department if you experience any worsening of your condition.  Thank you for allowing [place] to be a part of your care. Please speak to your pharmacist about any new medications prescribed today in regards to side effects or interactions with other medications.     IMPRESSION:  1. Right renal atrophy with mild hydronephrosis.  2. Small nonobstructing renal calculi bilaterally.  3. Numerous mildly enlarged mesenteric lymph nodes, nonspecific.  These may be reactive/inflammatory although low-grade  lymphoproliferative disorder is not excluded.  4. New 3 mm left lower lobe lung nodule. No follow-up needed if  patient is low-risk. Non-contrast chest CT can be considered in 12  months if patient is high-risk. This recommendation follows the  consensus statement: Guidelines for Management of Incidental  Pulmonary Nodules Detected on CT Images: From the Fleischner Society  2017; Radiology 2017; 284:228-243.

## 2022-09-16 ENCOUNTER — Encounter (HOSPITAL_COMMUNITY): Payer: Self-pay

## 2022-09-16 ENCOUNTER — Ambulatory Visit (HOSPITAL_COMMUNITY)
Admission: EM | Admit: 2022-09-16 | Discharge: 2022-09-16 | Disposition: A | Discharge status: Home / Self Care | Payer: Medicaid Other | Attending: Nurse Practitioner | Admitting: Nurse Practitioner

## 2022-09-16 ENCOUNTER — Ambulatory Visit (INDEPENDENT_AMBULATORY_CARE_PROVIDER_SITE_OTHER): Payer: Medicaid Other

## 2022-09-16 DIAGNOSIS — K5900 Constipation, unspecified: Secondary | ICD-10-CM | POA: Insufficient documentation

## 2022-09-16 DIAGNOSIS — R1032 Left lower quadrant pain: Secondary | ICD-10-CM

## 2022-09-16 DIAGNOSIS — Z3202 Encounter for pregnancy test, result negative: Secondary | ICD-10-CM | POA: Insufficient documentation

## 2022-09-16 HISTORY — DX: Disorder of kidney and ureter, unspecified: N28.9

## 2022-09-16 LAB — POCT URINE PREGNANCY: Preg Test, Ur: NEGATIVE

## 2022-09-16 LAB — POCT URINALYSIS DIP (MANUAL ENTRY)
Glucose, UA: NEGATIVE mg/dL
Ketones, POC UA: NEGATIVE mg/dL
Leukocytes, UA: NEGATIVE
Nitrite, UA: NEGATIVE
Protein Ur, POC: 100 mg/dL — AB
Spec Grav, UA: >=1.03 — AB (ref 1.010–1.025)
Urobilinogen, UA: 0.2 E.U./dL
pH, UA: 6 (ref 5.0–8.0)

## 2022-09-16 MED ORDER — MAGNESIUM CITRATE PO SOLN: 1.0000 | Freq: Once | ORAL | 0 refills | Status: AC

## 2022-09-16 MED ORDER — BISACODYL 10 MG RE SUPP: 10.0000 mg | RECTAL | 0 refills | Status: AC | PRN

## 2022-09-16 NOTE — ED Triage Notes (Addendum)
Patient c/o LLQ abdominal pain. X 1 week. Patient states a week ago she had a salad and had facial swelling. Patient states no normal BM in 4 days. Patient states she has been taking laxatives and had a small liquid BM yesterday.  Patient states she feels swollen and has pressure in her LLQ. Patient states she feels gassy, but not passing any gas.

## 2022-09-16 NOTE — ED Provider Notes (Signed)
[facility]    CSN: [id number] Arrival date & time: 09/16/22  1142      History   Chief Complaint Chief Complaint  Patient presents with   Abdominal Pain    HPI Tonya Howard is a 47 y.o. female.   Patient presents today with 1 week history of left lower quadrant abdominal pain.  Reports initially when the pain started, the pain was intermittent, but today she woke up and has been constant today.  She describes the pain as severe, sharp, and stabbing pain.  The patient denies radiation of pain around to her back or down her leg.  She reports tactile fevers, however no nausea or vomiting or Weight loss.  Reports appetite has been decreased over the past few days and she has been struggling to have a bowel movement or passed any gas.  Reports after the nurse took her triage and while she was waiting for me to come in the room, she passed gas a couple of times which significantly helped with pain.  She denies blood in the stool, heartburn, rash, dysuria, foul urinary odor, hematuria or urinary frequency.  No vaginal discharge, or pelvic pain.  Reports last menstrual period was about 1 month ago, she is competent she is not pregnant as she is not currently sexually active.  Reports any movement or pushing on the abdomen makes the pain worse.  Denies history of abdominal surgeries.  Has taken "natural" laxatives and was able to pass some very watery stool, but the pain persisted.    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   Urinary tract infection without hematuria 12/04/2015    Past Surgical History:  Procedure Laterality Date   BREAST SURGERY Right 1997   benign   IR GENERIC HISTORICAL  12/05/2015   IR NEPHROSTOMY PLACEMENT RIGHT 12/05/2015 [facility]   NEPHROLITHOTOMY Right 12/15/2015    Procedure: NEPHROLITHOTOMY PERCUTANEOUS FIRST STAGE;  Surgeon: Bjorn Pippin, MD;  Location: [facility];  Service: Urology;  Laterality: Right;    OB History     Gravida  1   Para      Term      Preterm      AB  1   Living         SAB  1   IAB      Ectopic      Multiple      Live Births               Home Medications    Prior to Admission medications   Medication Sig Start Date End Date Taking? Authorizing Provider  bisacodyl (DULCOLAX) 10 MG suppository Place 1 suppository (10 mg total) rectally as needed for moderate constipation. 09/16/22  Yes Cathlean Marseilles A, NP  magnesium citrate SOLN Take 296 mLs (1 Bottle total) by mouth once for 1 dose. 09/16/22 09/16/22 Yes Valentino Nose, NP  Aspirin-Acetaminophen-Caffeine (GOODYS EXTRA STRENGTH) 520-260-32.5 MG PACK Take 1 packet by mouth daily as needed (for headache).    [provider]  Aspirin-Caffeine (BC FAST PAIN RELIEF) 845-65 MG PACK Take 1 packet by mouth daily as needed (for headache).    [provider]  folic acid (FOLVITE) 400 MCG tablet Take 400 mcg by mouth daily.  [provider]  hydroxypropyl methylcellulose / hypromellose (ISOPTO TEARS / GONIOVISC) 2.5 % ophthalmic solution Place 1-2 drops into both eyes 2 (two) times daily as needed for dry eyes.    [provider]  medroxyPROGESTERone (PROVERA) 10 MG tablet Take 1 tablet (10 mg total) by mouth daily. Use for ten days Patient not taking: Reported on 12/04/2015 10/22/15   Tereso Newcomer, MD  Melatonin 10 MG TABS Take 10 mg by mouth at bedtime as needed (for sleep.).     [provider]  Multiple Vitamin (MULTIVITAMIN WITH MINERALS) TABS tablet Take 1 tablet by mouth daily.    [provider]    Family History Family History  Problem Relation Age of Onset   Diabetes Father    Cancer Maternal Grandmother    Diabetes Paternal Grandfather     Social History Social History   Tobacco Use    Smoking status: Every Day    Current packs/day: 0.25    Types: Cigarettes   Smokeless tobacco: Never  Vaping Use   Vaping status: Never Used  Substance Use Topics   Alcohol use: Yes   Drug use: No     Allergies   Shellfish allergy   Review of Systems Review of Systems Per HPI  Physical Exam Triage Vital Signs ED Triage Vitals  Encounter Vitals Group     BP 09/16/22 1208 116/81     Systolic BP Percentile --      Diastolic BP Percentile --      Pulse Rate 09/16/22 1208 94     Resp 09/16/22 1208 16     Temp 09/16/22 1208 98.1 F (36.7 C)     Temp Source 09/16/22 1208 Oral     SpO2 09/16/22 1208 98 %     Weight --      Height --      Head Circumference --      Peak Flow --      Pain Score 09/16/22 1210 10     Pain Loc --      Pain Education --      Exclude from Growth Chart --    No data found.  Updated Vital Signs BP 116/81 (BP Location: Left Arm)   Pulse 94   Temp 98.1 F (36.7 C) (Oral)   Resp 16   LMP 08/19/2022 (Approximate)   SpO2 98%   Visual Acuity Right Eye Distance:   Left Eye Distance:   Bilateral Distance:    Right Eye Near:   Left Eye Near:    Bilateral Near:     Physical Exam Vitals and nursing note reviewed.  Constitutional:      General: She is not in acute distress.    Appearance: Normal appearance. She is not toxic-appearing.  HENT:     Head: Normocephalic and atraumatic.     Mouth/Throat:     Mouth: Mucous membranes are moist.     Pharynx: Oropharynx is clear.  Eyes:     General: No scleral icterus.    Extraocular Movements: Extraocular movements intact.  Cardiovascular:     Rate and Rhythm: Normal rate and regular rhythm.  Pulmonary:     Effort: Pulmonary effort is normal. No respiratory distress.     Breath sounds: Normal breath sounds. No wheezing, rhonchi or rales.  Abdominal:     General: Abdomen is flat. Bowel sounds are decreased. There is no distension.     Palpations: Abdomen is soft.     Tenderness: There  is abdominal tenderness in the left lower quadrant. There is no right CVA tenderness, left CVA tenderness or guarding. Negative signs include Murphy's sign, Rovsing's sign and McBurney's sign.  Musculoskeletal:     Cervical back: Normal range of motion.  Lymphadenopathy:     Cervical: No cervical adenopathy.  Skin:    General: Skin is warm and dry.     Capillary Refill: Capillary refill takes less than 2 seconds.     Coloration: Skin is not jaundiced or pale.     Findings: No erythema.  Neurological:     Mental Status: She is alert and oriented to person, place, and time.  Psychiatric:        Mood and Affect: Mood is not anxious.        Behavior: Behavior is cooperative.      UC Treatments / Results  Labs (all labs ordered are listed, but only abnormal results are displayed) Labs Reviewed  POCT URINALYSIS DIP (MANUAL ENTRY) - Abnormal; Notable for the following components:      Result Value   Color, UA orange (*)    Bilirubin, UA small (*)    Spec Grav, UA >=1.030 (*)    Blood, UA trace-intact (*)    Protein Ur, POC =100 (*)    All other components within normal limits  POCT URINE PREGNANCY  CERVICOVAGINAL ANCILLARY ONLY    EKG   Radiology No results found.  Procedures Procedures (including critical care time)  Medications Ordered in UC Medications - No data to display  Initial Impression / Assessment and Plan / UC Course  I have reviewed the triage vital signs and the nursing notes.  Pertinent labs & imaging results that were available during my care of the patient were reviewed by me and considered in my medical decision making (see chart for details).   Patient is well-appearing, normotensive, afebrile, not tachycardic, not tachypneic, oxygenating well on room air.    1. LLQ abdominal pain 2. Constipation, unspecified constipation type Vital signs are reassuring today, examination reveals tenderness over left lower quadrant, however no tenderness over  McBurney's point or other red flags Urinalysis shows elevated specific gravity with trace intact blood, suspect some unreliability secondary to color of urine Abdominal x-ray obtained and reviewed by myself and Dr. Terrilee Croak prior to discharge-no fluid levels visualized or obvious small bowel obstruction Suspect pain secondary to constipation/gas Treat with Gas-X, bisacodyl, magnesium citrate Strict ER precautions discussed with patient Work excuse given  3. Urine pregnancy test negative  The patient was given the opportunity to ask questions.  All questions answered to their satisfaction.  The patient is in agreement to this plan.    Final Clinical Impressions(s) / UC Diagnoses   Final diagnoses:  LLQ abdominal pain  Urine pregnancy test negative  Constipation, unspecified constipation type     Discharge Instructions      The urine test today does not show a urinary tract infection.  I will contact if the abdominal x-ray shows anything that requires further treatment later today.  I suspect you are constipated.  Please start giving yourself 1 suppository daily as needed to help with the constipation and take the magnesium citrate.  Continue to increase water intake.  Seek care in the emergency room if you develop severe pain, nausea/vomiting and unable to keep fluids down.     ED Prescriptions     Medication Sig Dispense Auth. Provider   bisacodyl (DULCOLAX) 10 MG suppository Place 1 suppository (10 mg total)  rectally as needed for moderate constipation. 12 suppository Cathlean Marseilles A, NP   magnesium citrate SOLN Take 296 mLs (1 Bottle total) by mouth once for 1 dose. 296 mL Valentino Nose, NP      PDMP not reviewed this encounter.   Valentino Nose, NP 09/16/22 1428

## 2022-09-16 NOTE — Discharge Instructions (Signed)
The urine test today does not show a urinary tract infection.  I will contact if the abdominal x-ray shows anything that requires further treatment later today.  I suspect you are constipated.  Please start giving yourself 1 suppository daily as needed to help with the constipation and take the magnesium citrate.  Continue to increase water intake.  Seek care in the emergency room if you develop severe pain, nausea/vomiting and unable to keep fluids down.

## 2022-09-19 ENCOUNTER — Ambulatory Visit (HOSPITAL_COMMUNITY)
Admission: EM | Admit: 2022-09-19 | Discharge: 2022-09-19 | Disposition: A | Discharge status: Home / Self Care | Payer: Self-pay

## 2022-09-19 ENCOUNTER — Emergency Department (HOSPITAL_COMMUNITY): Payer: Medicaid Other

## 2022-09-19 ENCOUNTER — Emergency Department (HOSPITAL_COMMUNITY)
Admission: EM | Admit: 2022-09-19 | Discharge: 2022-09-19 | Disposition: A | Discharge status: Home / Self Care | Payer: Medicaid Other | Attending: Emergency Medicine | Admitting: Emergency Medicine

## 2022-09-19 ENCOUNTER — Encounter (HOSPITAL_COMMUNITY): Payer: Self-pay | Admitting: Emergency Medicine

## 2022-09-19 ENCOUNTER — Encounter (HOSPITAL_COMMUNITY): Payer: Self-pay

## 2022-09-19 DIAGNOSIS — K5730 Diverticulosis of large intestine without perforation or abscess without bleeding: Secondary | ICD-10-CM | POA: Diagnosis not present

## 2022-09-19 DIAGNOSIS — K5732 Diverticulitis of large intestine without perforation or abscess without bleeding: Secondary | ICD-10-CM | POA: Insufficient documentation

## 2022-09-19 DIAGNOSIS — R1032 Left lower quadrant pain: Secondary | ICD-10-CM | POA: Diagnosis present

## 2022-09-19 DIAGNOSIS — K5792 Diverticulitis of intestine, part unspecified, without perforation or abscess without bleeding: Secondary | ICD-10-CM

## 2022-09-19 DIAGNOSIS — K429 Umbilical hernia without obstruction or gangrene: Secondary | ICD-10-CM | POA: Diagnosis not present

## 2022-09-19 DIAGNOSIS — R9431 Abnormal electrocardiogram [ECG] [EKG]: Secondary | ICD-10-CM | POA: Diagnosis not present

## 2022-09-19 DIAGNOSIS — R109 Unspecified abdominal pain: Secondary | ICD-10-CM | POA: Diagnosis not present

## 2022-09-19 DIAGNOSIS — K5720 Diverticulitis of large intestine with perforation and abscess without bleeding: Secondary | ICD-10-CM | POA: Diagnosis not present

## 2022-09-19 LAB — URINALYSIS, ROUTINE W REFLEX MICROSCOPIC
Bilirubin Urine: NEGATIVE
Glucose, UA: NEGATIVE mg/dL
Hgb urine dipstick: NEGATIVE
Ketones, ur: NEGATIVE mg/dL
Leukocytes,Ua: NEGATIVE
Nitrite: NEGATIVE
Protein, ur: NEGATIVE mg/dL
Specific Gravity, Urine: 1.017 (ref 1.005–1.030)
pH: 6 (ref 5.0–8.0)

## 2022-09-19 LAB — COMPREHENSIVE METABOLIC PANEL
ALT: 20 U/L (ref 0–44)
AST: 20 U/L (ref 15–41)
Albumin: 3.9 g/dL (ref 3.5–5.0)
Alkaline Phosphatase: 81 U/L (ref 38–126)
Anion gap: 12 (ref 5–15)
BUN: 9 mg/dL (ref 6–20)
CO2: 26 mmol/L (ref 22–32)
Calcium: 9.6 mg/dL (ref 8.9–10.3)
Chloride: 98 mmol/L (ref 98–111)
Creatinine, Ser: 0.87 mg/dL (ref 0.44–1.00)
GFR, Estimated: >60 mL/min (ref >60)
Glucose, Bld: 94 mg/dL (ref 70–99)
Potassium: 3.7 mmol/L (ref 3.5–5.1)
Sodium: 136 mmol/L (ref 135–145)
Total Bilirubin: 0.4 mg/dL (ref 0.3–1.2)
Total Protein: 8.6 g/dL — ABNORMAL HIGH (ref 6.5–8.1)

## 2022-09-19 LAB — CERVICOVAGINAL ANCILLARY ONLY
Bacterial Vaginitis (gardnerella): NEGATIVE
Candida Glabrata: NEGATIVE
Candida Vaginitis: NEGATIVE
Chlamydia: NEGATIVE
Comment: NEGATIVE
Comment: NEGATIVE
Comment: NEGATIVE
Comment: NEGATIVE
Comment: NEGATIVE
Comment: NORMAL
Neisseria Gonorrhea: NEGATIVE
Trichomonas: NEGATIVE

## 2022-09-19 LAB — LIPASE, BLOOD: Lipase: 25 U/L (ref 11–51)

## 2022-09-19 LAB — CBC
HCT: 40.6 % (ref 36.0–46.0)
Hemoglobin: 13.4 g/dL (ref 12.0–15.0)
MCH: 30.3 pg (ref 26.0–34.0)
MCHC: 33 g/dL (ref 30.0–36.0)
MCV: 91.9 fL (ref 80.0–100.0)
Platelets: 349 10*3/uL (ref 150–400)
RBC: 4.42 MIL/uL (ref 3.87–5.11)
RDW: 12.9 % (ref 11.5–15.5)
WBC: 12.4 10*3/uL — ABNORMAL HIGH (ref 4.0–10.5)
nRBC: 0 % (ref 0.0–0.2)

## 2022-09-19 LAB — HCG, SERUM, QUALITATIVE: Preg, Serum: NEGATIVE

## 2022-09-19 MED ORDER — OXYCODONE HCL 5 MG PO TABS: 5.0000 mg | ORAL_TABLET | Freq: Four times a day (QID) | ORAL | 0 refills | Status: AC | PRN

## 2022-09-19 MED ORDER — LACTATED RINGERS IV BOLUS
1000.0000 mL | Freq: Once | INTRAVENOUS | Status: AC
  Administered 2022-09-19: 1000 mL via INTRAVENOUS

## 2022-09-19 MED ORDER — SODIUM CHLORIDE 0.9 % IV SOLN
3.0000 g | Freq: Once | INTRAVENOUS | Status: AC
  Administered 2022-09-19: 3 g via INTRAVENOUS
  Filled 2022-09-19: qty 8

## 2022-09-19 MED ORDER — ONDANSETRON HCL 4 MG PO TABS: 4.0000 mg | ORAL_TABLET | Freq: Four times a day (QID) | ORAL | 0 refills | Status: AC

## 2022-09-19 MED ORDER — MORPHINE SULFATE (PF) 4 MG/ML IV SOLN
6.0000 mg | Freq: Once | INTRAVENOUS | Status: AC
  Administered 2022-09-19: 6 mg via INTRAVENOUS
  Filled 2022-09-19: qty 2

## 2022-09-19 MED ORDER — AMOXICILLIN-POT CLAVULANATE 875-125 MG PO TABS: 1.0000 | ORAL_TABLET | Freq: Two times a day (BID) | ORAL | 0 refills | Status: AC

## 2022-09-19 MED ORDER — ONDANSETRON HCL 4 MG/2ML IJ SOLN
4.0000 mg | Freq: Once | INTRAMUSCULAR | Status: AC
  Administered 2022-09-19: 4 mg via INTRAVENOUS
  Filled 2022-09-19: qty 2

## 2022-09-19 MED ORDER — ONDANSETRON 4 MG PO TBDP
4.0000 mg | ORAL_TABLET | Freq: Once | ORAL | Status: AC
  Administered 2022-09-19: 4 mg via ORAL
  Filled 2022-09-19: qty 1

## 2022-09-19 MED ORDER — IOHEXOL 300 MG/ML  SOLN
100.0000 mL | Freq: Once | INTRAMUSCULAR | Status: AC | PRN
  Administered 2022-09-19: 100 mL via INTRAVENOUS

## 2022-09-19 MED ORDER — OXYCODONE HCL 5 MG PO TABS
5.0000 mg | ORAL_TABLET | Freq: Once | ORAL | Status: AC
  Administered 2022-09-19: 5 mg via ORAL
  Filled 2022-09-19: qty 1

## 2022-09-19 NOTE — ED Triage Notes (Signed)
Pt reports that was here 3 days ago and told constipated and was prescribed more medications for it. Reports that pain is still severe, reports having green and runny stools, solid one on Saturday and one solid stool Sunday. Only had two meals since last week. Reports lost 8 pounds since in over 10 day period.

## 2022-09-19 NOTE — ED Notes (Signed)
Attempted to obtain labs,unable 

## 2022-09-19 NOTE — ED Notes (Signed)
Patient is being discharged from the Urgent Care and sent to the Emergency Department via POV. Per Erma Pinto, PA, patient is in need of higher level of care due to abdominal pain and weight loss. Patient is aware and verbalizes understanding of plan of care.  Vitals:   09/19/22 1252  BP: 131/83  Pulse: 91  Resp: 15  Temp: 98.1 F (36.7 C)  SpO2: 98%

## 2022-09-19 NOTE — ED Provider Notes (Signed)
[facility] AT [facility] Provider Note   CSN: [id number] Arrival date & time: 09/19/22  1312     History Chief Complaint  Patient presents with   Abdominal Pain    HPI Tonya Howard is a 47 y.o. female presenting for abdominal pain.  47 year old female minimal medical history.  States that since Friday she has had 10 out of 10 abdominal pain in her left lower quadrant.  States that she does not like going to the doctor and is otherwise healthy that she can try to treat conservatively at home with laxative therapy.  She started having green mucousy stool.  Pain has been persistent. Denies fevers.  Feels chills. Severe nausea without emesis.  No history of similar..   Patient's recorded medical, surgical, social, medication list and allergies were reviewed in the Snapshot window as part of the initial history.   Review of Systems   Review of Systems  Constitutional:  Negative for chills and fever.  HENT:  Negative for ear pain and sore throat.   Eyes:  Negative for pain and visual disturbance.  Respiratory:  Negative for cough and shortness of breath.   Cardiovascular:  Negative for chest pain and palpitations.  Gastrointestinal:  Positive for abdominal distention, abdominal pain, diarrhea and nausea. Negative for vomiting.  Genitourinary:  Negative for dysuria and hematuria.  Musculoskeletal:  Negative for arthralgias and back pain.  Skin:  Negative for color change and rash.  Neurological:  Negative for seizures and syncope.  All other systems reviewed and are negative.   Physical Exam Updated Vital Signs BP 102/62   Pulse 84   Temp 98.2 F (36.8 C) (Oral)   Resp 18   Ht 5\' 6"  (1.676 m)   Wt 75.8 kg   LMP 08/19/2022 (Approximate)   SpO2 96%   BMI 26.95 kg/m  Physical Exam Vitals and nursing note reviewed.  Constitutional:      General: She is not in acute distress.    Appearance: She is well-developed.  HENT:     Head:  Normocephalic and atraumatic.  Eyes:     Conjunctiva/sclera: Conjunctivae normal.  Cardiovascular:     Rate and Rhythm: Normal rate and regular rhythm.     Heart sounds: No murmur heard. Pulmonary:     Effort: Pulmonary effort is normal. No respiratory distress.     Breath sounds: Normal breath sounds.  Abdominal:     General: There is no distension.     Palpations: Abdomen is soft.     Tenderness: There is abdominal tenderness in the periumbilical area, suprapubic area and left lower quadrant. There is guarding. There is no right CVA tenderness or left CVA tenderness.  Musculoskeletal:        General: No swelling or tenderness. Normal range of motion.     Cervical back: Neck supple.  Skin:    General: Skin is warm and dry.  Neurological:     General: No focal deficit present.     Mental Status: She is alert and oriented to person, place, and time. Mental status is at baseline.     Cranial Nerves: No cranial nerve deficit.      ED Course/ Medical Decision Making/ A&P    Procedures Procedures   Medications Ordered in ED Medications  lactated ringers bolus 1,000 mL (0 mLs Intravenous Stopped 09/19/22 2041)  morphine (PF) 4 MG/ML injection 6 mg (6 mg Intravenous Given 09/19/22 1802)  lactated ringers bolus 1,000 mL (0 mLs  Intravenous Stopped 09/19/22 2322)  ondansetron (ZOFRAN) injection 4 mg (4 mg Intravenous Given 09/19/22 1803)  iohexol (OMNIPAQUE) 300 MG/ML solution 100 mL (100 mLs Intravenous Contrast Given 09/19/22 1848)  Ampicillin-Sulbactam (UNASYN) 3 g in sodium chloride 0.9 % 100 mL IVPB (0 g Intravenous Stopped 09/19/22 2323)  oxyCODONE (Oxy IR/ROXICODONE) immediate release tablet 5 mg (5 mg Oral Given 09/19/22 2049)  ondansetron (ZOFRAN-ODT) disintegrating tablet 4 mg (4 mg Oral Given 09/19/22 2049)   Medical Decision Making:   Tonya Howard is a 47 y.o. female who presented to the ED today with abdominal pain, detailed above.    Patient placed on continuous vitals  and telemetry monitoring while in ED which was reviewed periodically.  Complete initial physical exam performed, notably the patient  was with exquisite left lower quadrant tenderness including guarding of any attempted exam.  Tachycardic to the low 100s.     Reviewed and confirmed nursing documentation for past medical history, family history, social history.    Initial Assessment:   With the patient's presentation of abdominal pain, most likely diagnosis is diverticulitis with possible abscess/perforation. Other diagnoses were considered including (but not limited to) gastroenteritis, colitis, small bowel obstruction, appendicitis, cholecystitis, pancreatitis, nephrolithiasis, UTI, pyleonephritis, ruptured ectopic pregnancy, PID, ovarian torsion. These are considered less likely due to history of present illness and physical exam findings.   This is most consistent with an acute life/limb threatening illness complicated by underlying chronic conditions.   Initial Plan:  CBC/CMP to evaluate for underlying infectious/metabolic etiology for patient's abdominal pain  Lipase to evaluate for pancreatitis  EKG to evaluate for cardiac source of pain  CTAB/Pelvis with contrast to evaluate for structural/surgical etiology of patients' severe abdominal pain.  Urinalysis and repeat physical assessment to evaluate for UTI/Pyelonpehritis  Empiric management of symptoms with escalating pain control and antiemetics as needed.   Initial Study Results:   Laboratory  All laboratory results reviewed without evidence of clinically relevant pathology.     EKG EKG was reviewed independently. Rate, rhythm, axis, intervals all examined and without medically relevant abnormality. ST segments without concerns for elevations.    Radiology All images reviewed independently. Agree with radiology report at this time.   CT ABDOMEN PELVIS W CONTRAST  Result Date: 09/19/2022 CLINICAL DATA:  Acute abdominal pain EXAM:  CT ABDOMEN AND PELVIS WITH CONTRAST TECHNIQUE: Multidetector CT imaging of the abdomen and pelvis was performed using the standard protocol following bolus administration of intravenous contrast. RADIATION DOSE REDUCTION: This exam was performed according to the departmental dose-optimization program which includes automated exposure control, adjustment of the mA and/or kV according to patient size and/or use of iterative reconstruction technique. CONTRAST:  OMNIPAQUE IOHEXOL 300 MG/ML  SOLN COMPARISON:  CT abdomen and pelvis 06/18/2020. CT abdomen and pelvis 12/04/2015. FINDINGS: Lower chest: There is a 3 mm left lower lobe pulmonary nodule which is unchanged from 2017 compatible with benign etiology. Lung bases are otherwise clear. Hepatobiliary: There is diffuse fatty infiltration of the liver. There is a rounded hypodensity in the left lobe of the liver which is too small to characterize and unchanged from 2017, likely cyst or hemangioma. No new liver lesions are identified. Gallbladder and bile ducts are within normal limits. Pancreas: Unremarkable. No pancreatic ductal dilatation or surrounding inflammatory changes. Spleen: Normal in size without focal abnormality. Adrenals/Urinary Tract: There is right renal cortical scarring and mild atrophy, unchanged. There is no hydronephrosis or perinephric stranding in either kidney. The adrenal glands are within normal limits.  Bladder is within normal limits. Stomach/Bowel: There is descending and sigmoid colon diverticulosis. There is wall thickening and inflammation involving diverticula in the distal descending colon. There is an adjacent anterior enhancing fluid collection containing some small foci of air abutting the anterior abdominal wall measuring 4.9 x 1.8 x 2.8 cm. There is no free air in this region. There is no bowel obstruction. The appendix, small bowel and stomach are within normal limits. Vascular/Lymphatic: No significant vascular findings are  present. No enlarged abdominal or pelvic lymph nodes. Reproductive: Uterus and bilateral adnexa are unremarkable. Other: There is a small fat containing umbilical hernia. There is no free fluid. Musculoskeletal: No acute or significant osseous findings. IMPRESSION: 1. Acute diverticulitis of the distal descending colon with adjacent abscess measuring 4.9 x 1.8 x 2.8 cm. 2. Fatty infiltration of the liver. Electronically Signed   By: Darliss Cheney M.D.   On: 09/19/2022 20:13   DG Abd Acute W/Chest  Result Date: 09/16/2022 CLINICAL DATA:  Left lower quadrant abdominal pain. Hypoactive bowel sounds. EXAM: DG ABDOMEN ACUTE WITH 1 VIEW CHEST COMPARISON:  None. FINDINGS: There is no evidence of dilated bowel loops or free intraperitoneal air. No radiopaque calculi or other significant radiographic abnormality is seen. Heart size and mediastinal contours are within normal limits. Both lungs are clear. IMPRESSION: Negative abdominal radiographs.  No acute cardiopulmonary disease. Electronically Signed   By: Orvan Falconer M.D.   On: 09/16/2022 14:56     Final Reassessment and Plan:   Patient's presentation is most consistent with diverticulitis.  Treated with beta-lactam and beta-lactamase inhibitor in the emergency room and symptomatically improved.  Will start on p.o. antibiotics for the next 5 days and recommend follow-up with PCP for ongoing care and management.  Strict return precautions reinforced and patient discharged in stable condition.     Clinical Impression:  1. Diverticulitis      Discharge   Final Clinical Impression(s) / ED Diagnoses Final diagnoses:  Diverticulitis    Rx / DC Orders ED Discharge Orders          Ordered    oxyCODONE (ROXICODONE) 5 MG immediate release tablet  Every 6 hours PRN        09/19/22 2129    ondansetron (ZOFRAN) 4 MG tablet  Every 6 hours        09/19/22 2129    amoxicillin-clavulanate (AUGMENTIN) 875-125 MG tablet  2 times daily        09/19/22 2129               Glyn Ade, MD 09/19/22 2354

## 2022-09-19 NOTE — ED Provider Notes (Signed)
Patient here today for worsening symptoms of abdominal pain- advised she needed further evaluation in the ED for stat imaging, etc. Patient is agreeable and will transport via POV- contracts she feels safe to do so.    Tomi Bamberger, PA-C 09/19/22 1259

## 2022-09-19 NOTE — ED Notes (Signed)
Mult IVs attempted. IV team consult ordered.

## 2022-09-19 NOTE — ED Triage Notes (Signed)
Patient has left lower abdominal pain since Tuesday. Has been taking laxatives and suppositories since. Her stool is green and runny. No vomiting.

## 2022-11-02 DIAGNOSIS — K5792 Diverticulitis of intestine, part unspecified, without perforation or abscess without bleeding: Secondary | ICD-10-CM | POA: Diagnosis not present

## 2022-11-02 DIAGNOSIS — K5780 Diverticulitis of intestine, part unspecified, with perforation and abscess without bleeding: Secondary | ICD-10-CM | POA: Diagnosis not present

## 2022-11-02 DIAGNOSIS — Z1211 Encounter for screening for malignant neoplasm of colon: Secondary | ICD-10-CM | POA: Diagnosis not present

## 2022-11-02 DIAGNOSIS — K5720 Diverticulitis of large intestine with perforation and abscess without bleeding: Secondary | ICD-10-CM | POA: Diagnosis not present

## 2022-11-14 DIAGNOSIS — R933 Abnormal findings on diagnostic imaging of other parts of digestive tract: Secondary | ICD-10-CM | POA: Diagnosis not present

## 2022-11-14 DIAGNOSIS — Z538 Procedure and treatment not carried out for other reasons: Secondary | ICD-10-CM | POA: Diagnosis not present

## 2022-12-08 DIAGNOSIS — N898 Other specified noninflammatory disorders of vagina: Secondary | ICD-10-CM | POA: Diagnosis not present

## 2022-12-08 DIAGNOSIS — Z7689 Persons encountering health services in other specified circumstances: Secondary | ICD-10-CM | POA: Diagnosis not present

## 2022-12-08 DIAGNOSIS — Z124 Encounter for screening for malignant neoplasm of cervix: Secondary | ICD-10-CM | POA: Diagnosis not present

## 2022-12-08 DIAGNOSIS — Z1231 Encounter for screening mammogram for malignant neoplasm of breast: Secondary | ICD-10-CM | POA: Diagnosis not present

## 2022-12-08 DIAGNOSIS — Z0000 Encounter for general adult medical examination without abnormal findings: Secondary | ICD-10-CM | POA: Diagnosis not present

## 2022-12-08 DIAGNOSIS — Z113 Encounter for screening for infections with a predominantly sexual mode of transmission: Secondary | ICD-10-CM | POA: Diagnosis not present

## 2023-02-01 DIAGNOSIS — Z419 Encounter for procedure for purposes other than remedying health state, unspecified: Secondary | ICD-10-CM | POA: Diagnosis not present

## 2023-03-04 DIAGNOSIS — Z419 Encounter for procedure for purposes other than remedying health state, unspecified: Secondary | ICD-10-CM | POA: Diagnosis not present

## 2023-04-01 DIAGNOSIS — Z419 Encounter for procedure for purposes other than remedying health state, unspecified: Secondary | ICD-10-CM | POA: Diagnosis not present

## 2023-05-13 DIAGNOSIS — Z419 Encounter for procedure for purposes other than remedying health state, unspecified: Secondary | ICD-10-CM | POA: Diagnosis not present

## 2023-06-12 DIAGNOSIS — Z419 Encounter for procedure for purposes other than remedying health state, unspecified: Secondary | ICD-10-CM | POA: Diagnosis not present

## 2023-07-13 DIAGNOSIS — Z419 Encounter for procedure for purposes other than remedying health state, unspecified: Secondary | ICD-10-CM | POA: Diagnosis not present

## 2023-08-12 DIAGNOSIS — Z419 Encounter for procedure for purposes other than remedying health state, unspecified: Secondary | ICD-10-CM | POA: Diagnosis not present

## 2023-09-12 DIAGNOSIS — Z419 Encounter for procedure for purposes other than remedying health state, unspecified: Secondary | ICD-10-CM | POA: Diagnosis not present

## 2023-10-13 DIAGNOSIS — Z419 Encounter for procedure for purposes other than remedying health state, unspecified: Secondary | ICD-10-CM | POA: Diagnosis not present

## 2024-01-12 DIAGNOSIS — Z419 Encounter for procedure for purposes other than remedying health state, unspecified: Secondary | ICD-10-CM | POA: Diagnosis not present
